# Patient Record
Sex: Male | Born: 1955
Health system: Southern US, Community
[De-identification: ages and names within clinical notes are randomized; demographics above are authoritative.]

## PROBLEM LIST (undated history)

## (undated) DIAGNOSIS — I10 Essential (primary) hypertension: Secondary | ICD-10-CM

## (undated) DIAGNOSIS — R0602 Shortness of breath: Secondary | ICD-10-CM

## (undated) DIAGNOSIS — K219 Gastro-esophageal reflux disease without esophagitis: Secondary | ICD-10-CM

## (undated) DIAGNOSIS — M1990 Unspecified osteoarthritis, unspecified site: Secondary | ICD-10-CM

## (undated) DIAGNOSIS — C801 Malignant (primary) neoplasm, unspecified: Secondary | ICD-10-CM

## (undated) HISTORY — PX: HERNIA REPAIR: SHX51

## (undated) HISTORY — PX: COLONOSCOPY: SHX174

## (undated) HISTORY — DX: Essential (primary) hypertension: I10

---

## 2008-04-13 ENCOUNTER — Encounter: Payer: Self-pay | Admitting: Gastroenterology

## 2008-05-03 ENCOUNTER — Ambulatory Visit (HOSPITAL_COMMUNITY): Admission: RE | Admit: 2008-05-03 | Discharge: 2008-05-03 | Payer: Self-pay | Admitting: Gastroenterology

## 2008-05-03 ENCOUNTER — Encounter: Payer: Self-pay | Admitting: Gastroenterology

## 2008-05-03 ENCOUNTER — Ambulatory Visit: Payer: Self-pay | Admitting: Gastroenterology

## 2010-06-03 NOTE — Op Note (Signed)
NAMEISSAK, GOLEY                ACCOUNT NO.:  [id number]   MEDICAL RECORD NO.:  [id number]          PATIENT TYPE:  AMB   LOCATION:  DAY                           FACILITY:  [facility]   PHYSICIAN:  Kassie Mends, M.D.      DATE OF BIRTH:  [birth year]   DATE OF PROCEDURE:  05/03/2008  DATE OF DISCHARGE:                               OPERATIVE REPORT   REFERRING PHYSICIAN:  Scott A. Gerda Diss, MD   PROCEDURE:  Colonoscopy with cold forceps polypectomy.   INDICATION FOR EXAM:  Mr. Popelka is a 55 year old male, who presents for  colon cancer screening. He has a family history of colon cancer.   FINDINGS:  1. A 3-4 mm polyps removed via cold forceps.  The polyps were in the      descending colon.  Two sigmoid colon polyps were removed via cold      forceps.  One rectal polyp removed via cold forceps.  2. Frequent diverticula seen extending from the sigmoid colon to the      hepatic flexure.  No masses, inflammatory changes, or AVMs seen.  3. Small internal hemorrhoids.  Anal canal lesion below the dentate      line.  Biopsy via cold forceps.  Otherwise, no polyps seen on      retroflexed view.   DIAGNOSES:  1. Colon polyps.  2. Moderate diverticulosis.  3. Small internal hemorrhoids.  4. Anal canal lesion likely an external hemorrhoid.   RECOMMENDATIONS:  1. Will call Mr. Chapdelaine with the results of his biopsies.  He may have      screening colonoscopy in 5 years.  2. He should follow a high-fiber diet.  He is given a handout on high-      fiber diet, polyps, and hemorrhoids.  3. No aspirin, NSAIDs, or anticoagulation for 7 days.   MEDICATIONS:  1. Demerol 100 mg IV.  2. Versed 6 mg IV.   PROCEDURE TECHNIQUE:  Physical exam was performed.  Informed consent was  obtained from the patient after explaining the benefits, risks, and  alternatives to the procedure.  The patient was connected to the monitor  and placed in left lateral position.  Continuous oxygen was provided by  nasal  cannula.  IV medicine administered through an indwelling cannula.  After administration of sedation and rectal exam, the patient's rectum  was intubated and the scope was advanced under direct visualization to  the cecum.  The scope was removed slowly by carefully examining the  color, texture, anatomy, and integrity of the mucosa on the way out.  The patient was recovered in endoscopy and discharged home in  satisfactory condition.   ADDENDUM 6110:  Simple adenoma and hyperplastic polyp. I belive he has a  family history of colon cancer. If so, TCS in 5 years. [phone] for  pt to call to discuss interval for TCS.      Kassie Mends, M.D.  Electronically Signed     SM/MEDQ  D:  05/03/2008  T:  05/04/2008  Job:  [id number]   cc:   Lorin Picket A. Gerda Diss, MD  Fax: [phone]

## 2012-07-13 ENCOUNTER — Other Ambulatory Visit: Payer: Self-pay | Admitting: *Deleted

## 2012-07-13 ENCOUNTER — Encounter: Payer: Self-pay | Admitting: *Deleted

## 2012-07-13 MED ORDER — LISINOPRIL 20 MG PO TABS: 20.0000 mg | ORAL_TABLET | Freq: Every day | ORAL | Status: DC

## 2012-08-26 ENCOUNTER — Other Ambulatory Visit: Payer: Self-pay | Admitting: Family Medicine

## 2012-08-26 LAB — BASIC METABOLIC PANEL
BUN: 17 mg/dL (ref 6–23)
Calcium: 8.8 mg/dL (ref 8.4–10.5)
Potassium: 4.4 mEq/L (ref 3.5–5.3)
Sodium: 139 mEq/L (ref 135–145)

## 2012-08-26 LAB — LIPID PANEL
Cholesterol: 197 mg/dL (ref 0–200)
Total CHOL/HDL Ratio: 6.8 Ratio
Triglycerides: 233 mg/dL — ABNORMAL HIGH (ref <150)
VLDL: 47 mg/dL — ABNORMAL HIGH (ref 0–40)

## 2012-08-30 ENCOUNTER — Ambulatory Visit (INDEPENDENT_AMBULATORY_CARE_PROVIDER_SITE_OTHER): Payer: Managed Care, Other (non HMO) | Admitting: Family Medicine

## 2012-08-30 ENCOUNTER — Encounter: Payer: Self-pay | Admitting: Family Medicine

## 2012-08-30 VITALS — BP 138/86 | Ht 66.0 in | Wt 250.6 lb

## 2012-08-30 DIAGNOSIS — I10 Essential (primary) hypertension: Secondary | ICD-10-CM

## 2012-08-30 DIAGNOSIS — M25551 Pain in right hip: Secondary | ICD-10-CM

## 2012-08-30 DIAGNOSIS — E785 Hyperlipidemia, unspecified: Secondary | ICD-10-CM | POA: Insufficient documentation

## 2012-08-30 DIAGNOSIS — M25559 Pain in unspecified hip: Secondary | ICD-10-CM

## 2012-08-30 DIAGNOSIS — E669 Obesity, unspecified: Secondary | ICD-10-CM

## 2012-08-30 MED ORDER — LISINOPRIL-HYDROCHLOROTHIAZIDE 20-12.5 MG PO TABS: 1.0000 | ORAL_TABLET | Freq: Every day | ORAL | Status: DC

## 2012-08-30 NOTE — Progress Notes (Signed)
  Subjective:    Patient ID: Brian Salazar, male    DOB: 1955-08-01, 57 y.o.   MRN: [id number]  HPI  Patient arrives for follow up on blood pressure and lab work. Patient does have high blood pressure he does try to take his medicine on regular basis he notices that it has been mildly elevated recently he denies any chest tightness pressure pain shortness breath or swelling in the legs. Denies any rectal bleeding. Recently had labs. These were went over with him in detail. His LDL slightly higher than what we would like to see HDL is lower. Patient has not tolerated statins in the past. Patient also relates he has had arthralgias going to have hip surgery in the near future he is hoping he'll be able to be more active once he gets his surgery completed. PMH HTN  Review of Systems See above    Objective:   Physical Exam Lungs are clear. Heart regular. Blood pressure mildly elevated abdomen soft obese extremities no edema       Assessment & Plan:  Essential hypertension, benign  Hyperlipidemia  Obesity, unspecified  Arthralgia of hip, right  Patient has hip replacement coming up soon he should do well with this. Increase blood pressure medication. Lisinopril/HCTZ. 20/12.5. Recheck blood pressure again he will check this at home he'll followup here within 5 months

## 2012-08-30 NOTE — Patient Instructions (Signed)
DASH Diet  The DASH diet stands for "Dietary Approaches to Stop Hypertension." It is a healthy eating plan that has been shown to reduce high blood pressure (hypertension) in as little as 14 days, while also possibly providing other significant health benefits. These other health benefits include reducing the risk of breast cancer after menopause and reducing the risk of type 2 diabetes, heart disease, colon cancer, and stroke. Health benefits also include weight loss and slowing kidney failure in patients with chronic kidney disease.   DIET GUIDELINES  · Limit salt (sodium). Your diet should contain less than 1500 mg of sodium daily.  · Limit refined or processed carbohydrates. Your diet should include mostly whole grains. Desserts and added sugars should be used sparingly.  · Include small amounts of heart-healthy fats. These types of fats include nuts, oils, and tub margarine. Limit saturated and trans fats. These fats have been shown to be harmful in the body.  CHOOSING FOODS   The following food groups are based on a 2000 calorie diet. See your Registered Dietitian for individual calorie needs.  Grains and Grain Products (6 to 8 servings daily)  · Eat More Often: Whole-wheat bread, brown rice, whole-grain or wheat pasta, quinoa, popcorn without added fat or salt (air popped).  · Eat Less Often: White bread, white pasta, white rice, cornbread.  Vegetables (4 to 5 servings daily)  · Eat More Often: Fresh, frozen, and canned vegetables. Vegetables may be raw, steamed, roasted, or grilled with a minimal amount of fat.  · Eat Less Often/Avoid: Creamed or fried vegetables. Vegetables in a cheese sauce.  Fruit (4 to 5 servings daily)  · Eat More Often: All fresh, canned (in natural juice), or frozen fruits. Dried fruits without added sugar. One hundred percent fruit juice (½ cup [237 mL] daily).  · Eat Less Often: Dried fruits with added sugar. Canned fruit in light or heavy syrup.  Lean Meats, Fish, and Poultry (2  servings or less daily. One serving is 3 to 4 oz [85-114 g]).  · Eat More Often: Ninety percent or leaner ground beef, tenderloin, sirloin. Round cuts of beef, chicken breast, turkey breast. All fish. Grill, bake, or broil your meat. Nothing should be fried.  · Eat Less Often/Avoid: Fatty cuts of meat, turkey, or chicken leg, thigh, or wing. Fried cuts of meat or fish.  Dairy (2 to 3 servings)  · Eat More Often: Low-fat or fat-free milk, low-fat plain or light yogurt, reduced-fat or part-skim cheese.  · Eat Less Often/Avoid: Milk (whole, 2%). Whole milk yogurt. Full-fat cheeses.  Nuts, Seeds, and Legumes (4 to 5 servings per week)  · Eat More Often: All without added salt.  · Eat Less Often/Avoid: Salted nuts and seeds, canned beans with added salt.  Fats and Sweets (limited)  · Eat More Often: Vegetable oils, tub margarines without trans fats, sugar-free gelatin. Mayonnaise and salad dressings.  · Eat Less Often/Avoid: Coconut oils, palm oils, butter, stick margarine, cream, half and half, cookies, candy, pie.  FOR MORE INFORMATION  The Dash Diet Eating Plan: www.dashdiet.org  Document Released: 12/25/2010 Document Revised: 03/30/2011 Document Reviewed: 12/25/2010  [employer]® Patient Information ©2014 [employer].

## 2012-08-31 NOTE — Progress Notes (Signed)
Need orders in EPIC.  Surgery scheduled for 09/16/12.  Thank You.  

## 2012-09-02 ENCOUNTER — Other Ambulatory Visit (HOSPITAL_COMMUNITY): Payer: Self-pay | Admitting: Orthopaedic Surgery

## 2012-09-05 ENCOUNTER — Encounter (HOSPITAL_COMMUNITY): Payer: Self-pay | Admitting: Pharmacy Technician

## 2012-09-08 ENCOUNTER — Other Ambulatory Visit (HOSPITAL_COMMUNITY): Payer: Self-pay | Admitting: Orthopaedic Surgery

## 2012-09-08 NOTE — Patient Instructions (Addendum)
22 Brian Salazar  09/08/2012 YOUR SURGERY IS SCHEDULED AT [facility]  ON:  Friday  8/29  REPORT TO [facility] STAY [facility] AT:  9:30 AM      PHONE # FOR SHORT STAY IS [phone]  DO NOT EAT OR DRINK ANYTHING AFTER MIDNIGHT THE NIGHT BEFORE YOUR SURGERY.  YOU MAY BRUSH YOUR TEETH, RINSE OUT YOUR MOUTH--BUT NO WATER, NO FOOD, NO CHEWING GUM, NO MINTS, NO CANDIES, NO CHEWING TOBACCO.  PLEASE TAKE THE FOLLOWING MEDICATIONS THE AM OF YOUR SURGERY WITH A FEW SIPS OF WATER:   PRILOSEC     DO NOT BRING VALUABLES, MONEY, CREDIT CARDS.  DO NOT WEAR JEWELRY, MAKE-UP, NAIL POLISH AND NO METAL PINS OR CLIPS IN YOUR HAIR. CONTACT LENS, DENTURES / PARTIALS, GLASSES SHOULD NOT BE WORN TO SURGERY AND IN MOST CASES-HEARING AIDS WILL NEED TO BE REMOVED.  BRING YOUR GLASSES CASE, ANY EQUIPMENT NEEDED FOR YOUR CONTACT LENS. FOR PATIENTS ADMITTED TO THE HOSPITAL--CHECK OUT TIME THE DAY OF DISCHARGE IS 11:00 AM.  ALL INPATIENT ROOMS ARE PRIVATE - WITH BATHROOM, TELEPHONE, TELEVISION AND WIFI INTERNET.                              PLEASE READ OVER ANY  FACT SHEETS THAT YOU WERE GIVEN: MRSA INFORMATION, BLOOD TRANSFUSION INFORMATION FAILURE TO FOLLOW THESE INSTRUCTIONS MAY RESULT IN THE CANCELLATION OF YOUR SURGERY.   PATIENT SIGNATURE_________________________________

## 2012-09-09 ENCOUNTER — Encounter (HOSPITAL_COMMUNITY): Payer: Self-pay

## 2012-09-09 ENCOUNTER — Ambulatory Visit (HOSPITAL_COMMUNITY)
Admission: RE | Admit: 2012-09-09 | Discharge: 2012-09-09 | Disposition: A | Discharge status: Home / Self Care | Payer: Managed Care, Other (non HMO) | Source: Ambulatory Visit | Attending: Orthopaedic Surgery | Admitting: Orthopaedic Surgery

## 2012-09-09 ENCOUNTER — Encounter (HOSPITAL_COMMUNITY)
Admission: RE | Admit: 2012-09-09 | Discharge: 2012-09-09 | Disposition: A | Discharge status: Home / Self Care | Payer: Managed Care, Other (non HMO) | Source: Ambulatory Visit | Attending: Orthopaedic Surgery | Admitting: Orthopaedic Surgery

## 2012-09-09 DIAGNOSIS — Z01818 Encounter for other preprocedural examination: Secondary | ICD-10-CM | POA: Insufficient documentation

## 2012-09-09 DIAGNOSIS — Z01812 Encounter for preprocedural laboratory examination: Secondary | ICD-10-CM | POA: Insufficient documentation

## 2012-09-09 DIAGNOSIS — M1610 Unilateral primary osteoarthritis, unspecified hip: Secondary | ICD-10-CM | POA: Insufficient documentation

## 2012-09-09 DIAGNOSIS — M169 Osteoarthritis of hip, unspecified: Secondary | ICD-10-CM | POA: Insufficient documentation

## 2012-09-09 DIAGNOSIS — Z01810 Encounter for preprocedural cardiovascular examination: Secondary | ICD-10-CM | POA: Insufficient documentation

## 2012-09-09 HISTORY — DX: Gastro-esophageal reflux disease without esophagitis: K21.9

## 2012-09-09 HISTORY — DX: Shortness of breath: R06.02

## 2012-09-09 HISTORY — DX: Unspecified osteoarthritis, unspecified site: M19.90

## 2012-09-09 LAB — CBC
HCT: 44.6 % (ref 39.0–52.0)
MCH: 30.4 pg (ref 26.0–34.0)
MCHC: 34.5 g/dL (ref 30.0–36.0)
MCV: 88 fL (ref 78.0–100.0)
Platelets: 197 10*3/uL (ref 150–400)
RDW: 13.2 % (ref 11.5–15.5)
WBC: 6.9 10*3/uL (ref 4.0–10.5)

## 2012-09-09 LAB — BASIC METABOLIC PANEL
BUN: 17 mg/dL (ref 6–23)
CO2: 26 mEq/L (ref 19–32)
Calcium: 9.2 mg/dL (ref 8.4–10.5)
Chloride: 101 mEq/L (ref 96–112)
Creatinine, Ser: 1.1 mg/dL (ref 0.50–1.35)
Glucose, Bld: 100 mg/dL — ABNORMAL HIGH (ref 70–99)

## 2012-09-09 LAB — URINALYSIS, ROUTINE W REFLEX MICROSCOPIC
Bilirubin Urine: NEGATIVE
Hgb urine dipstick: NEGATIVE
Ketones, ur: NEGATIVE mg/dL
Nitrite: NEGATIVE
Specific Gravity, Urine: 1.017 (ref 1.005–1.030)
Urobilinogen, UA: 0.2 mg/dL (ref 0.0–1.0)

## 2012-09-09 LAB — PROTIME-INR: INR: 0.98 (ref 0.00–1.49)

## 2012-09-09 LAB — SURGICAL PCR SCREEN
MRSA, PCR: NEGATIVE
Staphylococcus aureus: POSITIVE — AB

## 2012-09-09 NOTE — Progress Notes (Signed)
09/09/12 1145  OBSTRUCTIVE SLEEP APNEA  Have you ever been diagnosed with sleep apnea through a sleep study? No  Do you snore loudly (loud enough to be heard through closed doors)?  0  Do you often feel tired, fatigued, or sleepy during the daytime? 0  Has anyone observed you stop breathing during your sleep? 0  Do you have, or are you being treated for high blood pressure? 1  BMI more than 35 kg/m2? 1  Age over 56 years old? 1  Neck circumference greater than 40 cm/18 inches? 0  Gender: 1  Obstructive Sleep Apnea Score 4  Score 4 or greater  Results sent to PCP

## 2012-09-09 NOTE — Pre-Procedure Instructions (Signed)
EKG AND CXR WERE DONE TODAY - PREOP AT [facility]. 

## 2012-09-16 ENCOUNTER — Encounter (HOSPITAL_COMMUNITY)
Admission: RE | Disposition: A | Discharge status: Home Health | Payer: Self-pay | Source: Ambulatory Visit | Attending: Orthopaedic Surgery

## 2012-09-16 ENCOUNTER — Encounter (HOSPITAL_COMMUNITY): Payer: Self-pay | Admitting: *Deleted

## 2012-09-16 ENCOUNTER — Inpatient Hospital Stay (HOSPITAL_COMMUNITY): Payer: Managed Care, Other (non HMO)

## 2012-09-16 ENCOUNTER — Inpatient Hospital Stay (HOSPITAL_COMMUNITY): Payer: Managed Care, Other (non HMO) | Admitting: Anesthesiology

## 2012-09-16 ENCOUNTER — Encounter (HOSPITAL_COMMUNITY): Payer: Self-pay | Admitting: Anesthesiology

## 2012-09-16 ENCOUNTER — Inpatient Hospital Stay (HOSPITAL_COMMUNITY)
Admission: RE | Admit: 2012-09-16 | Discharge: 2012-09-18 | DRG: 470 | Disposition: A | Discharge status: Home Health | Payer: Managed Care, Other (non HMO) | Source: Ambulatory Visit | Attending: Orthopaedic Surgery | Admitting: Orthopaedic Surgery

## 2012-09-16 DIAGNOSIS — M169 Osteoarthritis of hip, unspecified: Secondary | ICD-10-CM

## 2012-09-16 DIAGNOSIS — E669 Obesity, unspecified: Secondary | ICD-10-CM

## 2012-09-16 DIAGNOSIS — I10 Essential (primary) hypertension: Secondary | ICD-10-CM

## 2012-09-16 DIAGNOSIS — M25551 Pain in right hip: Secondary | ICD-10-CM

## 2012-09-16 DIAGNOSIS — M1610 Unilateral primary osteoarthritis, unspecified hip: Principal | ICD-10-CM | POA: Diagnosis present

## 2012-09-16 DIAGNOSIS — K219 Gastro-esophageal reflux disease without esophagitis: Secondary | ICD-10-CM | POA: Diagnosis present

## 2012-09-16 DIAGNOSIS — E785 Hyperlipidemia, unspecified: Secondary | ICD-10-CM

## 2012-09-16 HISTORY — PX: TOTAL HIP ARTHROPLASTY: SHX124

## 2012-09-16 LAB — TYPE AND SCREEN

## 2012-09-16 SURGERY — ARTHROPLASTY, HIP, TOTAL, ANTERIOR APPROACH
Anesthesia: General | Site: Hip | Laterality: Right | Wound class: Clean

## 2012-09-16 MED ORDER — LISINOPRIL 20 MG PO TABS
20.0000 mg | ORAL_TABLET | Freq: Every day | ORAL | Status: DC
  Administered 2012-09-17 – 2012-09-18 (×2): 20 mg via ORAL
  Filled 2012-09-16 (×2): qty 1

## 2012-09-16 MED ORDER — CEFAZOLIN SODIUM-DEXTROSE 2-3 GM-% IV SOLR
2.0000 g | INTRAVENOUS | Status: AC
  Administered 2012-09-16: 2 g via INTRAVENOUS

## 2012-09-16 MED ORDER — ACETAMINOPHEN 500 MG PO TABS
1000.0000 mg | ORAL_TABLET | Freq: Once | ORAL | Status: AC
  Administered 2012-09-16: 1000 mg via ORAL
  Filled 2012-09-16: qty 1

## 2012-09-16 MED ORDER — METOCLOPRAMIDE HCL 10 MG PO TABS: 5.0000 mg | ORAL_TABLET | Freq: Three times a day (TID) | ORAL | Status: DC | PRN

## 2012-09-16 MED ORDER — ACETAMINOPHEN 650 MG RE SUPP: 650.0000 mg | Freq: Four times a day (QID) | RECTAL | Status: DC | PRN

## 2012-09-16 MED ORDER — 0.9 % SODIUM CHLORIDE (POUR BTL) OPTIME
TOPICAL | Status: DC | PRN
  Administered 2012-09-16: 1000 mL

## 2012-09-16 MED ORDER — SODIUM CHLORIDE 0.9 % IR SOLN
Status: DC | PRN
  Administered 2012-09-16: 1000 mL

## 2012-09-16 MED ORDER — STERILE WATER FOR IRRIGATION IR SOLN
Status: DC | PRN
  Administered 2012-09-16: 3000 mL

## 2012-09-16 MED ORDER — OXYCODONE HCL ER 10 MG PO T12A
10.0000 mg | EXTENDED_RELEASE_TABLET | Freq: Two times a day (BID) | ORAL | Status: DC
  Administered 2012-09-16 – 2012-09-18 (×4): 10 mg via ORAL
  Filled 2012-09-16 (×5): qty 1

## 2012-09-16 MED ORDER — MEPERIDINE HCL 50 MG/ML IJ SOLN: 6.2500 mg | INTRAMUSCULAR | Status: DC | PRN

## 2012-09-16 MED ORDER — OXYCODONE HCL 5 MG/5ML PO SOLN
5.0000 mg | Freq: Once | ORAL | Status: DC | PRN
  Filled 2012-09-16: qty 5

## 2012-09-16 MED ORDER — PHENYLEPHRINE HCL 10 MG/ML IJ SOLN
10.0000 mg | INTRAVENOUS | Status: DC | PRN
  Administered 2012-09-16: 15 ug/min via INTRAVENOUS

## 2012-09-16 MED ORDER — PHENOL 1.4 % MT LIQD: 1.0000 | OROMUCOSAL | Status: DC | PRN

## 2012-09-16 MED ORDER — LACTATED RINGERS IV SOLN
INTRAVENOUS | Status: DC | PRN
  Administered 2012-09-16 (×4): via INTRAVENOUS

## 2012-09-16 MED ORDER — METHOCARBAMOL 500 MG PO TABS
500.0000 mg | ORAL_TABLET | Freq: Four times a day (QID) | ORAL | Status: DC | PRN
  Filled 2012-09-16: qty 1

## 2012-09-16 MED ORDER — ACETAMINOPHEN 325 MG PO TABS: 650.0000 mg | ORAL_TABLET | Freq: Four times a day (QID) | ORAL | Status: DC | PRN

## 2012-09-16 MED ORDER — CELECOXIB 200 MG PO CAPS
200.0000 mg | ORAL_CAPSULE | Freq: Once | ORAL | Status: AC
  Administered 2012-09-16: 200 mg via ORAL
  Filled 2012-09-16 (×2): qty 1

## 2012-09-16 MED ORDER — ONDANSETRON HCL 4 MG/2ML IJ SOLN
4.0000 mg | Freq: Four times a day (QID) | INTRAMUSCULAR | Status: DC | PRN
  Administered 2012-09-16: 4 mg via INTRAVENOUS
  Filled 2012-09-16: qty 2

## 2012-09-16 MED ORDER — PROPOFOL 10 MG/ML IV BOLUS
INTRAVENOUS | Status: DC | PRN
  Administered 2012-09-16: 30 mg via INTRAVENOUS

## 2012-09-16 MED ORDER — HYDROMORPHONE HCL PF 1 MG/ML IJ SOLN
0.2500 mg | INTRAMUSCULAR | Status: DC | PRN
  Administered 2012-09-16 (×2): 0.5 mg via INTRAVENOUS

## 2012-09-16 MED ORDER — PROPOFOL INFUSION 10 MG/ML OPTIME
INTRAVENOUS | Status: DC | PRN
  Administered 2012-09-16: 160 ug/kg/min via INTRAVENOUS

## 2012-09-16 MED ORDER — ONDANSETRON HCL 4 MG PO TABS: 4.0000 mg | ORAL_TABLET | Freq: Four times a day (QID) | ORAL | Status: DC | PRN

## 2012-09-16 MED ORDER — MENTHOL 3 MG MT LOZG: 1.0000 | LOZENGE | OROMUCOSAL | Status: DC | PRN

## 2012-09-16 MED ORDER — OXYCODONE HCL 5 MG PO TABS
5.0000 mg | ORAL_TABLET | ORAL | Status: DC | PRN
  Administered 2012-09-16 – 2012-09-18 (×5): 10 mg via ORAL
  Filled 2012-09-16 (×6): qty 2

## 2012-09-16 MED ORDER — OXYCODONE HCL 5 MG PO TABS: 5.0000 mg | ORAL_TABLET | Freq: Once | ORAL | Status: DC | PRN

## 2012-09-16 MED ORDER — PROMETHAZINE HCL 25 MG/ML IJ SOLN: 6.2500 mg | INTRAMUSCULAR | Status: DC | PRN

## 2012-09-16 MED ORDER — ASPIRIN EC 325 MG PO TBEC
325.0000 mg | DELAYED_RELEASE_TABLET | Freq: Two times a day (BID) | ORAL | Status: DC
  Administered 2012-09-17 – 2012-09-18 (×3): 325 mg via ORAL
  Filled 2012-09-16 (×5): qty 1

## 2012-09-16 MED ORDER — CEFAZOLIN SODIUM 1-5 GM-% IV SOLN
1.0000 g | Freq: Four times a day (QID) | INTRAVENOUS | Status: AC
  Administered 2012-09-16 – 2012-09-17 (×2): 1 g via INTRAVENOUS
  Filled 2012-09-16 (×2): qty 50

## 2012-09-16 MED ORDER — DIPHENHYDRAMINE HCL 12.5 MG/5ML PO ELIX: 12.5000 mg | ORAL_SOLUTION | ORAL | Status: DC | PRN

## 2012-09-16 MED ORDER — PANTOPRAZOLE SODIUM 40 MG PO TBEC
40.0000 mg | DELAYED_RELEASE_TABLET | Freq: Every day | ORAL | Status: DC
  Administered 2012-09-17 – 2012-09-18 (×2): 40 mg via ORAL
  Filled 2012-09-16 (×2): qty 1

## 2012-09-16 MED ORDER — ALUM & MAG HYDROXIDE-SIMETH 200-200-20 MG/5ML PO SUSP: 30.0000 mL | ORAL | Status: DC | PRN

## 2012-09-16 MED ORDER — METHOCARBAMOL 100 MG/ML IJ SOLN
500.0000 mg | Freq: Four times a day (QID) | INTRAVENOUS | Status: DC | PRN
  Administered 2012-09-16: 500 mg via INTRAVENOUS
  Filled 2012-09-16: qty 5

## 2012-09-16 MED ORDER — HYDROMORPHONE HCL PF 1 MG/ML IJ SOLN
INTRAMUSCULAR | Status: AC
  Administered 2012-09-16: 1 mg via INTRAVENOUS
  Filled 2012-09-16: qty 1

## 2012-09-16 MED ORDER — SODIUM CHLORIDE 0.9 % IV SOLN
INTRAVENOUS | Status: DC
  Administered 2012-09-16 – 2012-09-17 (×2): via INTRAVENOUS

## 2012-09-16 MED ORDER — POLYETHYLENE GLYCOL 3350 17 G PO PACK
17.0000 g | PACK | Freq: Every day | ORAL | Status: DC | PRN
  Administered 2012-09-17: 22:00:00 17 g via ORAL

## 2012-09-16 MED ORDER — DOCUSATE SODIUM 100 MG PO CAPS
100.0000 mg | ORAL_CAPSULE | Freq: Two times a day (BID) | ORAL | Status: DC
  Administered 2012-09-16 – 2012-09-18 (×4): 100 mg via ORAL

## 2012-09-16 MED ORDER — LISINOPRIL-HYDROCHLOROTHIAZIDE 20-12.5 MG PO TABS: 1.0000 | ORAL_TABLET | Freq: Every day | ORAL | Status: DC

## 2012-09-16 MED ORDER — LACTATED RINGERS IV SOLN
INTRAVENOUS | Status: DC
  Administered 2012-09-16: 1000 mL via INTRAVENOUS

## 2012-09-16 MED ORDER — HYDROMORPHONE HCL PF 1 MG/ML IJ SOLN
1.0000 mg | INTRAMUSCULAR | Status: DC | PRN
  Administered 2012-09-16 – 2012-09-17 (×4): 1 mg via INTRAVENOUS
  Filled 2012-09-16 (×4): qty 1

## 2012-09-16 MED ORDER — HYDROCHLOROTHIAZIDE 12.5 MG PO CAPS
12.5000 mg | ORAL_CAPSULE | Freq: Every day | ORAL | Status: DC
  Administered 2012-09-17 – 2012-09-18 (×2): 12.5 mg via ORAL
  Filled 2012-09-16 (×2): qty 1

## 2012-09-16 MED ORDER — ZOLPIDEM TARTRATE 5 MG PO TABS: 5.0000 mg | ORAL_TABLET | Freq: Every evening | ORAL | Status: DC | PRN

## 2012-09-16 MED ORDER — FENTANYL CITRATE 0.05 MG/ML IJ SOLN
INTRAMUSCULAR | Status: DC | PRN
  Administered 2012-09-16 (×2): 50 ug via INTRAVENOUS

## 2012-09-16 MED ORDER — PHENYLEPHRINE HCL 10 MG/ML IJ SOLN
INTRAMUSCULAR | Status: DC | PRN
  Administered 2012-09-16: 80 ug via INTRAVENOUS
  Administered 2012-09-16: 120 ug via INTRAVENOUS
  Administered 2012-09-16: 80 ug via INTRAVENOUS

## 2012-09-16 MED ORDER — METOCLOPRAMIDE HCL 5 MG/ML IJ SOLN: 5.0000 mg | Freq: Three times a day (TID) | INTRAMUSCULAR | Status: DC | PRN

## 2012-09-16 MED ORDER — MIDAZOLAM HCL 5 MG/5ML IJ SOLN
INTRAMUSCULAR | Status: DC | PRN
  Administered 2012-09-16: 2 mg via INTRAVENOUS

## 2012-09-16 SURGICAL SUPPLY — 40 items
ADH SKN CLS APL DERMABOND .7 (GAUZE/BANDAGES/DRESSINGS) ×1
BAG SPEC THK2 15X12 ZIP CLS (MISCELLANEOUS) ×2
BAG ZIPLOCK 12X15 (MISCELLANEOUS) ×4 IMPLANT
BLADE SAW SGTL 18X1.27X75 (BLADE) ×2 IMPLANT
CAPT HIP PF MOP ×1 IMPLANT
CELLS DAT CNTRL 66122 CELL SVR (MISCELLANEOUS) ×1 IMPLANT
CLOTH BEACON ORANGE TIMEOUT ST (SAFETY) ×2 IMPLANT
DERMABOND ADVANCED (GAUZE/BANDAGES/DRESSINGS) ×1
DERMABOND ADVANCED .7 DNX12 (GAUZE/BANDAGES/DRESSINGS) ×1 IMPLANT
DRAPE C-ARM 42X120 X-RAY (DRAPES) ×2 IMPLANT
DRAPE STERI IOBAN 125X83 (DRAPES) ×2 IMPLANT
DRAPE U-SHAPE 47X51 STRL (DRAPES) ×6 IMPLANT
DRSG AQUACEL AG ADV 3.5X10 (GAUZE/BANDAGES/DRESSINGS) ×2 IMPLANT
DURAPREP 26ML APPLICATOR (WOUND CARE) ×2 IMPLANT
ELECT BLADE TIP CTD 4 INCH (ELECTRODE) ×2 IMPLANT
ELECT REM PT RETURN 9FT ADLT (ELECTROSURGICAL) ×2
ELECTRODE REM PT RTRN 9FT ADLT (ELECTROSURGICAL) ×1 IMPLANT
FACESHIELD LNG OPTICON STERILE (SAFETY) ×8 IMPLANT
GLOVE BIO SURGEON STRL SZ7.5 (GLOVE) ×2 IMPLANT
GLOVE BIOGEL PI IND STRL 8 (GLOVE) ×2 IMPLANT
GLOVE BIOGEL PI INDICATOR 8 (GLOVE) ×2
GLOVE ECLIPSE 8.0 STRL XLNG CF (GLOVE) ×2 IMPLANT
GOWN STRL REIN XL XLG (GOWN DISPOSABLE) ×4 IMPLANT
HANDPIECE INTERPULSE COAX TIP (DISPOSABLE) ×2
KIT BASIN OR (CUSTOM PROCEDURE TRAY) ×2 IMPLANT
PACK TOTAL JOINT (CUSTOM PROCEDURE TRAY) ×2 IMPLANT
PADDING CAST COTTON 6X4 STRL (CAST SUPPLIES) ×2 IMPLANT
RETRACTOR WND ALEXIS 18 MED (MISCELLANEOUS) ×1 IMPLANT
RTRCTR WOUND ALEXIS 18CM MED (MISCELLANEOUS) ×2
SET HNDPC FAN SPRY TIP SCT (DISPOSABLE) ×1 IMPLANT
SUT ETHIBOND NAB CT1 #1 30IN (SUTURE) ×4 IMPLANT
SUT ETHILON 3 0 PS 1 (SUTURE) ×2 IMPLANT
SUT MNCRL AB 4-0 PS2 18 (SUTURE) ×2 IMPLANT
SUT VIC AB 0 CT1 36 (SUTURE) ×2 IMPLANT
SUT VIC AB 1 CT1 36 (SUTURE) ×4 IMPLANT
SUT VIC AB 2-0 CT1 27 (SUTURE) ×4
SUT VIC AB 2-0 CT1 TAPERPNT 27 (SUTURE) ×2 IMPLANT
TOWEL OR 17X26 10 PK STRL BLUE (TOWEL DISPOSABLE) ×2 IMPLANT
TOWEL OR NON WOVEN STRL DISP B (DISPOSABLE) ×2 IMPLANT
TRAY FOLEY CATH 14FRSI W/METER (CATHETERS) ×2 IMPLANT

## 2012-09-16 NOTE — Transfer of Care (Signed)
Immediate Anesthesia Transfer of Care Note  Patient: Brian Salazar  Procedure(s) Performed: Procedure(s) with comments: RIGHT TOTAL HIP ARTHROPLASTY ANTERIOR APPROACH (Right) - Right Total Hip Arthroplasty, Anterior Approach  Patient Location: PACU  Anesthesia Type:Regional and Spinal  Level of Consciousness: awake, alert , oriented and patient cooperative  Airway & Oxygen Therapy: Patient Spontanous Breathing and Patient connected to face mask oxygen  Post-op Assessment: Report given to PACU RN and Post -op Vital signs reviewed and stable  Post vital signs: Reviewed and stable  Complications: No apparent anesthesia complications

## 2012-09-16 NOTE — Anesthesia Preprocedure Evaluation (Addendum)
Anesthesia Evaluation  Patient identified by MRN, date of birth, ID band Patient awake    Reviewed: Allergy & Precautions, H&P , NPO status , Patient's Chart, lab work & pertinent test results  Airway Mallampati: II TM Distance: >3 FB Neck ROM: Full    Dental  (+) Dental Advisory Given   Pulmonary shortness of breath and with exertion,  breath sounds clear to auscultation        Cardiovascular hypertension, Pt. on medications Rhythm:Regular Rate:Normal     Neuro/Psych negative neurological ROS  negative psych ROS   GI/Hepatic Neg liver ROS, GERD-  Medicated,  Endo/Other  negative endocrine ROS  Renal/GU negative Renal ROS     Musculoskeletal negative musculoskeletal ROS (+)   Abdominal (+) + obese,   Peds  Hematology negative hematology ROS (+)   Anesthesia Other Findings   Reproductive/Obstetrics negative OB ROS                          Anesthesia Physical Anesthesia Plan  ASA: III  Anesthesia Plan: Spinal   Post-op Pain Management:    Induction:   Airway Management Planned:   Additional Equipment:   Intra-op Plan:   Post-operative Plan:   Informed Consent: I have reviewed the patients History and Physical, chart, labs and discussed the procedure including the risks, benefits and alternatives for the proposed anesthesia with the patient or authorized representative who has indicated his/her understanding and acceptance.   Dental advisory given  Plan Discussed with: CRNA  Anesthesia Plan Comments:        Anesthesia Quick Evaluation

## 2012-09-16 NOTE — Brief Op Note (Signed)
09/16/2012  2:27 PM  PATIENT:  Brian Salazar  57 y.o. male  PRE-OPERATIVE DIAGNOSIS:  Severe OA/DJD right Hip  POST-OPERATIVE DIAGNOSIS:  Severe OA/DJD right Hip  PROCEDURE:  Procedure(s) with comments: RIGHT TOTAL HIP ARTHROPLASTY ANTERIOR APPROACH (Right) - Right Total Hip Arthroplasty, Anterior Approach  SURGEON:  Surgeon(s) and Role:    * Kathryne Hitch, MD - Primary  PHYSICIAN ASSISTANT: Rexene Edison, PA-C  ANESTHESIA:   spinal  EBL:  Total I/O In: 3000 [I.V.:3000] Out: 550 [Urine:100; Blood:450]  BLOOD ADMINISTERED:none  DRAINS: none   LOCAL MEDICATIONS USED:  NONE  SPECIMEN:  No Specimen  DISPOSITION OF SPECIMEN:  N/A  COUNTS:  YES  TOURNIQUET:  * No tourniquets in log *  DICTATION: .Other Dictation: Dictation Number [id number]  PLAN OF CARE: Admit to inpatient   PATIENT DISPOSITION:  PACU - hemodynamically stable.   Delay start of Pharmacological VTE agent (>24hrs) due to surgical blood loss or risk of bleeding: no

## 2012-09-16 NOTE — Anesthesia Procedure Notes (Signed)
Spinal  Patient location during procedure: OR Start time: 09/16/2012 12:42 PM End time: 09/16/2012 12:46 PM Staffing Anesthesiologist: Lewie Loron R Performed by: anesthesiologist  Preanesthetic Checklist Completed: patient identified, site marked, surgical consent, pre-op evaluation, timeout performed, IV checked, risks and benefits discussed and monitors and equipment checked Spinal Block Patient position: sitting Prep: ChloraPrep Patient monitoring: heart rate, continuous pulse ox and blood pressure Approach: midline Location: L2-3 Injection technique: single-shot Needle Needle type: Sprotte  Needle gauge: 24 G Needle length: 9 cm Assessment Sensory level: T8 Additional Notes Expiration date of kit checked and confirmed. Patient tolerated procedure well, without complications.

## 2012-09-16 NOTE — H&P (Signed)
TOTAL HIP ADMISSION H&P  Patient is admitted for right total hip arthroplasty.  Subjective:  Chief Complaint: right hip pain  HPI: Brian Salazar, 57 y.o. male, has a history of pain and functional disability in the right hip(s) due to arthritis and patient has failed non-surgical conservative treatments for greater than 12 weeks to include NSAID's and/or analgesics, corticosteriod injections, use of assistive devices, weight reduction as appropriate and activity modification.  Onset of symptoms was gradual starting 4 years ago with gradually worsening course since that time.The patient noted no past surgery on the right hip(s).  Patient currently rates pain in the right hip at 9 out of 10 with activity. Patient has night pain, worsening of pain with activity and weight bearing, trendelenberg gait, pain that interfers with activities of daily living and pain with passive range of motion. Patient has evidence of subchondral cysts, subchondral sclerosis, periarticular osteophytes and joint space narrowing by imaging studies. This condition presents safety issues increasing the risk of falls.  There is no current active infection.  Patient Active Problem List   Diagnosis Date Noted  . Degenerative arthritis of hip, right 09/16/2012  . Essential hypertension, benign 08/30/2012  . Hyperlipidemia 08/30/2012  . Obesity, unspecified 08/30/2012  . Arthralgia of hip 08/30/2012   Past Medical History  Diagnosis Date  . Hypertension   . Shortness of breath     with exertion  . GERD (gastroesophageal reflux disease)     occas  . Arthritis     OA RIGHT HIP    Past Surgical History  Procedure Laterality Date  . Colonoscopy      No prescriptions prior to admission   Allergies  Allergen Reactions  . Simvastatin     Made me feel funny    History  Substance Use Topics  . Smoking status: Never Smoker   . Smokeless tobacco: Never Used  . Alcohol Use: Yes     Comment: RARE ALCOHOL    No  family history on file.   Review of Systems  Musculoskeletal: Positive for joint pain.  All other systems reviewed and are negative.    Objective:  Physical Exam  Constitutional: He is oriented to person, place, and time. He appears well-developed and well-nourished.  HENT:  Head: Normocephalic and atraumatic.  Eyes: EOM are normal. Pupils are equal, round, and reactive to light.  Neck: Neck supple.  Cardiovascular: Normal rate and regular rhythm.   Respiratory: Effort normal and breath sounds normal.  GI: Soft. Bowel sounds are normal.  Musculoskeletal:       Right hip: He exhibits decreased range of motion, decreased strength, bony tenderness and crepitus.  Neurological: He is alert and oriented to person, place, and time.  Skin: Skin is warm and dry.  Psychiatric: He has a normal mood and affect.    Vital signs in last 24 hours:    Labs:   There is no height or weight on file to calculate BMI.   Imaging Review Plain radiographs demonstrate severe degenerative joint disease of the right hip(s). The bone quality appears to be excellent for age and reported activity level.  Assessment/Plan:  End stage arthritis, right hip(s)  The patient history, physical examination, clinical judgement of the provider and imaging studies are consistent with end stage degenerative joint disease of the right hip(s) and total hip arthroplasty is deemed medically necessary. The treatment options including medical management, injection therapy, arthroscopy and arthroplasty were discussed at length. The risks and benefits of total hip  arthroplasty were presented and reviewed. The risks due to aseptic loosening, infection, stiffness, dislocation/subluxation,  thromboembolic complications and other imponderables were discussed.  The patient acknowledged the explanation, agreed to proceed with the plan and consent was signed. Patient is being admitted for inpatient treatment for surgery, pain  control, PT, OT, prophylactic antibiotics, VTE prophylaxis, progressive ambulation and ADL's and discharge planning.The patient is planning to be discharged home with home health services

## 2012-09-17 LAB — BASIC METABOLIC PANEL
Calcium: 8.2 mg/dL — ABNORMAL LOW (ref 8.4–10.5)
Creatinine, Ser: 0.97 mg/dL (ref 0.50–1.35)
GFR calc Af Amer: >90 mL/min (ref >90)

## 2012-09-17 LAB — CBC
MCH: 29.8 pg (ref 26.0–34.0)
MCV: 88.5 fL (ref 78.0–100.0)
Platelets: 174 10*3/uL (ref 150–400)
RDW: 13.1 % (ref 11.5–15.5)

## 2012-09-17 MED ORDER — METHOCARBAMOL 500 MG PO TABS: 500.0000 mg | ORAL_TABLET | Freq: Four times a day (QID) | ORAL | Status: DC | PRN

## 2012-09-17 MED ORDER — OXYCODONE-ACETAMINOPHEN 5-325 MG PO TABS
1.0000 | ORAL_TABLET | ORAL | Status: DC | PRN
Start: 2012-09-17 — End: 2013-07-07

## 2012-09-17 MED ORDER — ASPIRIN 325 MG PO TBEC: 325.0000 mg | DELAYED_RELEASE_TABLET | Freq: Two times a day (BID) | ORAL | Status: DC

## 2012-09-17 NOTE — Care Management Note (Addendum)
    Page 1 of 1   09/18/2012     12:45:04 PM   CARE MANAGEMENT NOTE 09/18/2012  Patient:  Brian Salazar, Brian Salazar   Account Number:  [id number]  Date Initiated:  09/17/2012  Documentation initiated by:  [facility]  Subjective/Objective Assessment:   57 Y/O M ADMITTED W/R HIP DEGENERATIVE ARTHRITIS.     Action/Plan:   FROM HOMEW/SPOUSE.HAS PCP,PHARMACY.ALREADY HAS 3N1,RW.   Anticipated DC Date:  09/18/2012   Anticipated DC Plan:  HOME W HOME HEALTH SERVICES      DC Planning Services  CM consult      Choice offered to / List presented to:  C-1 Patient        HH arranged  HH-2 PT      [facility] agency  [facility]   Status of service:  Completed, signed off Medicare Important Message given?   (If response is "NO", the following Medicare IM given date fields will be blank) Date Medicare IM given:   Date Additional Medicare IM given:    Discharge Disposition:  HOME W HOME HEALTH SERVICES  Per UR Regulation:  Reviewed for med. necessity/level of care/duration of stay  If discussed at Long Length of Stay Meetings, dates discussed:    Comments:  09/18/12 Tyrelle Raczka RN,BSN NCM WEEKEND [phone] D/C HOME W/HHPT.[facility] REP JAMIE AWARE OF D/C,& HHPT ORDER.NO FURTHER D/C NEEDS OR ORDERS.  09/17/12 Rane Blitch RN,BSN NCM WEEKEND [phone] POD#1 R THA.PT-HH.[facility] CHOSEN FOR HH.TC [facility] REP JAMIE [phone],AWARE OF REFERRAL.AWAIT FINAL HH ORDERS.

## 2012-09-17 NOTE — Progress Notes (Signed)
Subjective:  Patient reports pain as moderate.  No events  Objective:   VITALS:   Filed Vitals:   09/17/12 0150 09/17/12 0620 09/17/12 0800 09/17/12 1012  BP: 143/77 135/87  159/88  Pulse: 75 75  89  Temp: 98.2 F (36.8 C) 98.5 F (36.9 C)  98.9 F (37.2 C)  TempSrc: Oral Oral  Oral  Resp: 16 16 18 16   Height:      Weight:      SpO2: 100% 100% 99% 98%    Neurologically intact Neurovascular intact Sensation intact distally Intact pulses distally Dorsiflexion/Plantar flexion intact Incision: dressing C/D/I No cellulitis present Compartment soft   Lab Results  Component Value Date   WBC 7.7 09/17/2012   HGB 13.0 09/17/2012   HCT 38.6* 09/17/2012   MCV 88.5 09/17/2012   PLT 174 09/17/2012     Assessment/Plan: 1 Day Post-Op   Principal Problem:   Degenerative arthritis of hip, right   Advance diet Up with therapy   Cheral Almas 09/17/2012, 10:22 AM

## 2012-09-17 NOTE — Progress Notes (Signed)
Physical Therapy Treatment Patient Details Name: Brian Salazar MRN: [id number] DOB: Jan 03, 1956 Today's Date: 09/17/2012 Time: 0865-7846 PT Time Calculation (min): 37 min  PT Assessment / Plan / Recommendation  History of Present Illness     PT Comments     Follow Up Recommendations  Home health PT     Does the patient have the potential to tolerate intense rehabilitation     Barriers to Discharge        Equipment Recommendations  None recommended by PT    Recommendations for Other Services OT consult  Frequency 7X/week   Progress towards PT Goals Progress towards PT goals: Progressing toward goals  Plan Current plan remains appropriate    Precautions / Restrictions Precautions Precautions: Fall Restrictions Weight Bearing Restrictions: No Other Position/Activity Restrictions: WBAT   Pertinent Vitals/Pain 4/10; premed    Mobility  Bed Mobility Bed Mobility: Sit to Supine Sit to Supine: 3: Mod assist Details for Bed Mobility Assistance: cues for sequence and use of UEs and L LE to self assist.  Physical assist for R LE management and to bring trunk to upright 2 * abdominal prominence Transfers Transfers: Sit to Stand;Stand to Sit Sit to Stand: 4: Min assist;From chair/3-in-1;With armrests;With upper extremity assist Stand to Sit: 4: Min assist;With upper extremity assist;To bed Details for Transfer Assistance: cues for LE management and use of UEs to self assist Ambulation/Gait Ambulation/Gait Assistance: 4: Min assist Ambulation Distance (Feet): 111 Feet Assistive device: Rolling walker Ambulation/Gait Assistance Details: cues for posture, sequence, stride length and position from RW Gait Pattern: Step-to pattern;Decreased step length - right;Decreased step length - left;Antalgic;Trunk flexed Gait velocity: slow General Gait Details: multiple standing rest breaks required for task completion Stairs: No    Exercises     PT Diagnosis:    PT Problem List:   PT  Treatment Interventions:     PT Goals (current goals can now be found in the care plan section) Acute Rehab PT Goals Patient Stated Goal: Resume previous lifestyle with decreased pain PT Goal Formulation: With patient Time For Goal Achievement: 09/24/12 Potential to Achieve Goals: Good  Visit Information  Last PT Received On: 09/17/12 Assistance Needed: +1    Subjective Data  Subjective: Wait just let me stop and stretch Patient Stated Goal: Resume previous lifestyle with decreased pain   Cognition  Cognition Arousal/Alertness: Awake/alert Behavior During Therapy: WFL for tasks assessed/performed Overall Cognitive Status: Within Functional Limits for tasks assessed    Balance     End of Session PT - End of Session Equipment Utilized During Treatment: Gait belt Activity Tolerance: Patient tolerated treatment well Patient left: in bed;with call bell/phone within reach;with family/visitor present Nurse Communication: Mobility status   GP     Brian Salazar 09/17/2012, 5:15 PM

## 2012-09-17 NOTE — Op Note (Signed)
NAMEJANI, PLOEGER NO.:  [id number]  MEDICAL RECORD NO.:  [id number]  LOCATION:  1605                         FACILITY:  [facility]  PHYSICIAN:  Vanita Panda. Magnus Ivan, M.D.DATE OF BIRTH:  02/21/1955  DATE OF PROCEDURE:  09/16/2012 DATE OF DISCHARGE:                              OPERATIVE REPORT   PREOPERATIVE DIAGNOSIS:  Severe end-stage arthritis and degenerative joint disease, right hip.  POSTOPERATIVE DIAGNOSIS:  Severe end-stage arthritis and degenerative joint disease, right hip.  PROCEDURE:  Right total hip arthroplasty through direct anterior approach.  IMPLANTS:  DePuy Sector Gription acetabular component size 54, size 36+ 4 neutral polyethylene liner, size 10 Corail femoral component with varus offset (KLA), size 36- 2 metal hip ball.  SURGEON:  Vanita Panda. Magnus Ivan, M.D.  ASSISTANT:  Richardean Canal, PA-C.  ANESTHESIA:  Spinal.  ANTIBIOTICS:  2 g IV Ancef.  BLOOD LOSS:  450 mL.  COMPLICATIONS:  None.  INDICATIONS:  Brian Salazar is a 57 year old gentleman well known to me. He has severe debilitating end-stage arthritis of his right hip.  He has failed conservative treatment.  He has had injections in his hip.  His pain is daily.  His mobility is limited.  His quality of life was diminished.  At this point, he wishes to proceed with a total hip arthroplasty through a direct anterior approach.  He understands the risks of acute blood loss anemia, nerve and vessel injury, fracture, infection, DVT.  The goals are to improve mobility, decrease pain, and overall improve quality of life.  PROCEDURE DESCRIPTION:  After informed consent was obtained, appropriate right hip was marked.  He was brought to the operating room and spinal anesthesia was obtained while he was on a stretcher.  He was then laid in supine position, a Foley catheter was placed and in both feet, traction boots applied to them.  He was next placed supine on the Hana fracture  table with the perineal post in place and both legs in inline skeletal traction devices, but no traction applied.  His hip and pelvis were assessed fluoroscopically.  We are able to the tape his abdomen all the way to get a good view.  We then had the right operative hip prepped and draped with DuraPrep and sterile drapes.  A time-out was called to identify the correct patient, correct right hip.  We then made an incision inferior and posterior to the anterior-superior iliac spine and carried this obliquely down the leg.  I dissected down to the tensor fascia lata muscle and the tensor fascia was divided longitudinally.  I then proceeded with a direct anterior approach of the hip.  I cauterized the lateral femoral circumflex vessels.  I then placed Cobra retractors around the lateral neck and the medial femoral neck.  I opened the hip capsule in a L-type format and placed Cobra retractors within the capsule and found significant changes of the cartilage.  I then made my femoral neck cut with an oscillating saw just proximal to the lesser trochanter and completed this on osteotome.  A corkscrew guide was placed in the femoral head, and the femoral head was removed in its entirety and found to be  devoid of cartilage.  I then cleaned the acetabular debris including remnants of acetabular labrum.  A bent Hohmann was placed medially and a Cobra retractor laterally.  I then began reaming and 2 mm increments from size 42 reamer all the way up to a size 54 reamer.  All reamers placed under direct visualization.  The last reamer also under direct fluoroscopy, so we could obtain our final depth of reaming and inclination and anteversion.  Once this was completed, I placed a real DePuy Sector Gription acetabular component size 54, the apex hole eliminator guide and a real 36+ 4 neutral polyethylene liner for a size 54 acetabular component.  Attention was then turned to the femur.  With the femur  externally rotated 100 degrees, extended and adducted, I placed a Mueller retractor medially and Hohmann retractor behind the greater trochanter.  I released the lateral joint capsule and then used a box cutting osteotome at the femoral canal and a rongeur to lateralize, I then began broaching from a size 8 broach using the Corail broaching system from DePuy, up to a size 10.  The 10 was nice stable fit.  I used a calcar planer off this and then trialed a varus offset neck given what his anatomy looked like.  We trialed a 36+ 1.5 femoral head, brought the leg back over and up with traction and internal rotation, reduced, and felt that it was stable throughout its arc of rotation with minimal shuck.  It was quite tight even with the measures of leg lengths seemed to be long, so we then dislocated the hip and trialed a 36- 2 hip ball and he was much better on leg lengths and offset.  We then dislocated the hip and removed all trial components.  We placed a real Corail femoral component from DePuy size 10 with varus offset and a real 36- 2 metal hip ball.  We reduced this back in the acetabulum and was again stable.  We then copiously irrigated the soft tissues with normal saline solution using pulsatile lavage.  We closed the joint capsule with interrupted #1 Ethibond suture followed by a running 0 V-Loc in the tensor fascia lata.  The 0 Vicryl in the deep tissue, 2-0 Vicryl in the subcutaneous tissue, 4-0 Monocryl subcuticular stitch and Dermabond on the skin, and Aquacel dressing was applied.  He was taken off the Hana table into the recovery room in stable condition.  All final counts were correct.  There were no complications noted.     Vanita Panda. Magnus Ivan, M.D.     CYB/MEDQ  D:  09/16/2012  T:  09/17/2012  Job:  [id number]

## 2012-09-17 NOTE — Evaluation (Signed)
Physical Therapy Evaluation Patient Details Name: Brian Salazar MRN: [id number] DOB: 1955-11-13 Today's Date: 09/17/2012 Time: 4098-1191 PT Time Calculation (min): 38 min  PT Assessment / Plan / Recommendation History of Present Illness     Clinical Impression  Pt s/p R THR presents with functional mobility limitations 2* decreased R LE strength/ROM and post op pain.    PT Assessment  Patient needs continued PT services    Follow Up Recommendations  Home health PT    Does the patient have the potential to tolerate intense rehabilitation      Barriers to Discharge        Equipment Recommendations  None recommended by PT    Recommendations for Other Services OT consult   Frequency 7X/week    Precautions / Restrictions Precautions Precautions: Fall Restrictions Weight Bearing Restrictions: No Other Position/Activity Restrictions: WBAT   Pertinent Vitals/Pain 4/10 at rest - increased with activity; premed, RN aware, ice packs provided      Mobility  Bed Mobility Bed Mobility: Supine to Sit Supine to Sit: 1: +2 Total assist Supine to Sit: Patient Percentage: 60% Details for Bed Mobility Assistance: cues for sequence and use of UEs and L LE to self assist.  Physical assist for R LE management and to bring trunk to upright 2 * abdominal prominence Transfers Transfers: Sit to Stand;Stand to Sit Sit to Stand: 4: Min assist;From bed;From elevated surface Stand to Sit: 4: Min assist;To chair/3-in-1;With upper extremity assist Details for Transfer Assistance: cues for LE management and use of UEs to self assist Ambulation/Gait Ambulation/Gait Assistance: 4: Min assist Ambulation Distance (Feet): 14 Feet Assistive device: Rolling walker Ambulation/Gait Assistance Details: cues for posture, sequence, position from RW Gait Pattern: Step-to pattern;Decreased step length - right;Decreased step length - left;Antalgic;Trunk flexed Gait velocity: slow General Gait Details: ltd  by increased pain and c/o getting too hot Stairs: No    Exercises Total Joint Exercises Ankle Circles/Pumps: AROM;Both;15 reps;Supine Quad Sets: AROM;Both;10 reps;Supine Heel Slides: AAROM;15 reps;Right;Supine Hip ABduction/ADduction: AAROM;Right;15 reps;Supine   PT Diagnosis: Difficulty walking  PT Problem List: Decreased strength;Decreased activity tolerance;Decreased range of motion;Decreased mobility;Decreased knowledge of use of DME;Pain;Obesity PT Treatment Interventions: DME instruction;Gait training;Stair training;Functional mobility training;Therapeutic activities;Therapeutic exercise;Patient/family education     PT Goals(Current goals can be found in the care plan section) Acute Rehab PT Goals Patient Stated Goal: Resume previous lifestyle with decreased pain PT Goal Formulation: With patient Time For Goal Achievement: 09/24/12 Potential to Achieve Goals: Good  Visit Information  Last PT Received On: 09/17/12 Assistance Needed: +2 (bed mobility 2* obesity)       Prior Functioning  Home Living Family/patient expects to be discharged to:: Private residence Living Arrangements: Spouse/significant other Available Help at Discharge: Family Type of Home: House Home Access: Stairs to enter Secretary/administrator of Steps: 5 Entrance Stairs-Rails: Right Home Layout: One level Home Equipment: Environmental consultant - 2 wheels;Crutches Prior Function Level of Independence: Independent Communication Communication: No difficulties    Cognition  Cognition Arousal/Alertness: Awake/alert Behavior During Therapy: WFL for tasks assessed/performed Overall Cognitive Status: Within Functional Limits for tasks assessed    Extremity/Trunk Assessment Upper Extremity Assessment Upper Extremity Assessment: Overall WFL for tasks assessed Lower Extremity Assessment Lower Extremity Assessment: RLE deficits/detail RLE Deficits / Details: Hip strength 2/5 with AAROM at hip to 70 flex and 20 abd    Balance    End of Session PT - End of Session Equipment Utilized During Treatment: Gait belt Activity Tolerance: Patient tolerated treatment well Patient left: in  chair;with call bell/phone within reach;with family/visitor present Nurse Communication: Mobility status  GP     Brian Salazar 09/17/2012, 1:24 PM

## 2012-09-18 LAB — CBC
Platelets: 171 10*3/uL (ref 150–400)
RDW: 13.3 % (ref 11.5–15.5)
WBC: 8.3 10*3/uL (ref 4.0–10.5)

## 2012-09-18 NOTE — Plan of Care (Signed)
Problem: Discharge Progression Outcomes Goal: Incision without S/S infection Outcome: Completed/Met Date Met:  09/18/12 dsg with no drainage noted. Surgical dsg remains in place

## 2012-09-18 NOTE — Evaluation (Signed)
Occupational Therapy Evaluation Patient Details Name: Brian Salazar MRN: [id number] DOB: 01-26-1955 Today's Date: 09/18/2012 Time: 4098-1191 OT Time Calculation (min): 22 min  OT Assessment / Plan / Recommendation History of present illness     Clinical Impression   Pt was admitted for R anterior direct THA. All education was completed, and pt does not need any further OT at this time.      OT Assessment  Patient does not need any further OT services    Follow Up Recommendations  No OT follow up    Barriers to Discharge      Equipment Recommendations  None recommended by OT    Recommendations for Other Services    Frequency       Precautions / Restrictions Precautions Precautions: Fall Restrictions Other Position/Activity Restrictions: WBAT   Pertinent Vitals/Pain 5/10 thigh, R.  Repositioned in chair    ADL  Grooming: Wash/dry hands;Wash/dry face;Supervision/safety Where Assessed - Grooming: Supported standing Toilet Transfer: Radiographer, therapeutic Method: Sit to Barista: Comfort height toilet;Grab bars Equipment Used: Rolling walker Transfers/Ambulation Related to ADLs: pt ambulated to bathroom with supervision; self-corrected sequence.  Pt confident:cued to keep walker in front of him at all times for optimal safety.  Pt wants to try standard commode at home.  He has a sink and wall to help him up and should be OK ADL Comments: Pt performs UB adls with set up and LB with min A.  Educated to respect pain and let himself heal.  He is very independent.   Pt may go to mother's for shower (walk in).  Reviewed tub readiness.   OT Diagnosis:    OT Problem List:   OT Treatment Interventions:     OT Goals(Current goals can be found in the care plan section)    Visit Information  Last OT Received On: 09/18/12 Assistance Needed: +1       Prior Functioning     Home Living Family/patient expects to be discharged to:: Private  residence Prior Function Level of Independence: Independent Communication Communication: No difficulties         Vision/Perception     Cognition  Cognition Arousal/Alertness: Awake/alert Behavior During Therapy: WFL for tasks assessed/performed Overall Cognitive Status: Within Functional Limits for tasks assessed    Extremity/Trunk Assessment Upper Extremity Assessment Upper Extremity Assessment: Overall WFL for tasks assessed     Mobility Bed Mobility Supine to Sit: 4: Min assist;HOB elevated;With rails Transfers Sit to Stand: 5: Supervision;From bed Stand to Sit: 5: Supervision Details for Transfer Assistance: pt keeps leg under him; prefers not to stretch out for comfort     Exercise     Balance     End of Session OT - End of Session Activity Tolerance: Patient tolerated treatment well Patient left: in chair;with call bell/phone within reach;with family/visitor present  GO     Brian Salazar 09/18/2012, 8:44 AM Marica Otter, OTR/L [phone] 09/18/2012

## 2012-09-18 NOTE — Discharge Summary (Signed)
Physician Discharge Summary  Patient ID: Brian Salazar MRN: [id number] DOB/AGE: [birth year] 55 y.o.  Admit date: 09/16/2012 Discharge date: 09/18/2012  Admission Diagnoses:  Degenerative arthritis of hip  Discharge Diagnoses:  Principal Problem:   Degenerative arthritis of hip, right   Past Medical History  Diagnosis Date  . Hypertension   . Shortness of breath     with exertion  . GERD (gastroesophageal reflux disease)     occas  . Arthritis     OA RIGHT HIP    Surgeries: Procedure(s): RIGHT TOTAL HIP ARTHROPLASTY ANTERIOR APPROACH on 09/16/2012   Consultants (if any):    Discharged Condition: Improved  Hospital Course: Brian Salazar is an 57 y.o. male who was admitted 09/16/2012 with a diagnosis of Degenerative arthritis of hip and went to the operating room on 09/16/2012 and underwent the above named procedures.    He was given perioperative antibiotics:  Anti-infectives   Start     Dose/Rate Route Frequency Ordered Stop   09/16/12 1800  ceFAZolin (ANCEF) IVPB 1 g/50 mL premix     1 g 100 mL/hr over 30 Minutes Intravenous Every 6 hours 09/16/12 1643 09/17/12 0030   09/16/12 1100  ceFAZolin (ANCEF) IVPB 2 g/50 mL premix     2 g 100 mL/hr over 30 Minutes Intravenous On call to O.R. 09/16/12 0957 09/16/12 1230    .  He was given sequential compression devices, early ambulation, and aspirin for DVT prophylaxis.  He benefited maximally from the hospital stay and there were no complications.    Recent vital signs:  Filed Vitals:   09/18/12 0605  BP: 130/83  Pulse: 90  Temp: 98.4 F (36.9 C)  Resp: 16    Recent laboratory studies:  Lab Results  Component Value Date   HGB 13.2 09/18/2012   HGB 13.0 09/17/2012   HGB 15.4 09/09/2012   Lab Results  Component Value Date   WBC 8.3 09/18/2012   PLT 171 09/18/2012   Lab Results  Component Value Date   INR 0.98 09/09/2012   Lab Results  Component Value Date   NA 135 09/17/2012   K 4.2 09/17/2012   CL 101  09/17/2012   CO2 28 09/17/2012   BUN 14 09/17/2012   CREATININE 0.97 09/17/2012   GLUCOSE 118* 09/17/2012    Discharge Medications:     Medication List    STOP taking these medications       ibuprofen 200 MG tablet  Commonly known as:  ADVIL,MOTRIN      TAKE these medications       aspirin 325 MG EC tablet  Take 1 tablet (325 mg total) by mouth 2 (two) times daily after a meal.     lisinopril-hydrochlorothiazide 20-12.5 MG per tablet  Commonly known as:  PRINZIDE,ZESTORETIC  Take 1 tablet by mouth daily at 12 noon.     methocarbamol 500 MG tablet  Commonly known as:  ROBAXIN  Take 1 tablet (500 mg total) by mouth every 6 (six) hours as needed.     naproxen sodium 220 MG tablet  Commonly known as:  ANAPROX  Take 220 mg by mouth 2 (two) times daily as needed (for pain).     omeprazole 20 MG capsule  Commonly known as:  PRILOSEC  Take 20 mg by mouth daily as needed.     oxyCODONE-acetaminophen 5-325 MG per tablet  Commonly known as:  ROXICET  Take 1-2 tablets by mouth every 4 (four) hours as needed for pain.  Diagnostic Studies: Dg Chest 2 View  09/09/2012   *RADIOLOGY REPORT*  Clinical Data: Preop for right total hip replacement  CHEST - 2 VIEW  Comparison: None.  Findings: Cardiomediastinal silhouette is unremarkable.  No acute infiltrate or pleural effusion.  No pulmonary edema.  Bony thorax is unremarkable.  IMPRESSION: No active disease.   Original Report Authenticated By: Natasha Mead, M.D.   Dg Hip Complete Right  09/16/2012   *RADIOLOGY REPORT*  Clinical Data: Right total hip arthroplasty.  RIGHT HIP - COMPLETE 2+ VIEW  Comparison: No priors.  Findings: Two intraoperative spot films of the right hip demonstrate placement of a right total hip prosthesis.  The femoral and acetabular components of the prosthesis appear properly located on these limited views.  No definite periprostatic fracture is identified on this fluoroscopic spot film.  Prosthetic femoral head  projects within the prosthetic acetabulum on these two views.  IMPRESSION: 1.  Intraoperative documentation of right total hip arthroplasty, as above, without definite acute complicating features.   Original Report Authenticated By: Trudie Reed, M.D.   Dg Pelvis Portable  09/16/2012   *RADIOLOGY REPORT*  Clinical Data: Postop right hip replacement  PORTABLE PELVIS  Comparison: Intraoperative fluoroscopic radiographs dated 09/16/2012  Findings: Right hip arthroplasty in satisfactory position on this single frontal view.  No fracture or dislocation is seen.  IMPRESSION: Right total hip arthroplasty in satisfactory position.   Original Report Authenticated By: Charline Bills, M.D.   Dg Hip Portable 1 View Right  09/16/2012   *RADIOLOGY REPORT*  Clinical Data: Postop right hip replacement  PORTABLE RIGHT HIP - 1 VIEW  Comparison: Intraoperative fluoroscopic radiographs dated 09/16/2012  Findings: Right total hip arthroplasty in satisfactory position on this single lateral view.  Associated subcutaneous gas.  IMPRESSION: Right total hip arthroplasty in satisfactory position.   Original Report Authenticated By: Charline Bills, M.D.   Dg C-arm 1-60 Min-no Report  09/16/2012   CLINICAL DATA: Right total hip anterior   C-ARM 1-60 MINUTES  Fluoroscopy was utilized by the requesting physician.  No radiographic  interpretation.     Disposition: Final discharge disposition not confirmed        Follow-up Information   Follow up with Kathryne Hitch, MD In 2 weeks.   Specialty:  Orthopedic Surgery   Contact information:   [address] [place] [address] [phone]        Signed: Cheral Almas 09/18/2012, 8:31 AM

## 2012-09-18 NOTE — Progress Notes (Signed)
Pt discharged to home. DC instructions given with wife at bedside. Prescriptions x 3 given. No concerns voiced. Left unit in wheelchair pushed by nurse tech. Left in good condition to meet wife in car downstairs. Vwilliams,rn.

## 2012-09-18 NOTE — Progress Notes (Signed)
Physical Therapy Treatment Patient Details Name: Brian Salazar MRN: [id number] DOB: Jun 16, 1955 Today's Date: 09/18/2012 Time: 6295-2841 PT Time Calculation (min): 53 min  PT Assessment / Plan / Recommendation  History of Present Illness     PT Comments   Pt and spouse educated on stairs and car transfers.  Pt motivated and progressing well with increased time all tasks with pt requesting multiple rest breaks to stretch.  Follow Up Recommendations  Home health PT     Does the patient have the potential to tolerate intense rehabilitation     Barriers to Discharge        Equipment Recommendations  None recommended by PT    Recommendations for Other Services OT consult  Frequency 7X/week   Progress towards PT Goals Progress towards PT goals: Goals met/education completed, patient discharged from PT  Plan Current plan remains appropriate    Precautions / Restrictions Precautions Precautions: Fall Restrictions Weight Bearing Restrictions: No Other Position/Activity Restrictions: WBAT   Pertinent Vitals/Pain 3/10; premed, ice pack provided    Mobility  Bed Mobility Bed Mobility: Sit to Supine Sit to Supine: 4: Min assist Details for Bed Mobility Assistance: Pt attempted crossed ankle to assist R LE with L but R knee would not tolerate.  Attemted  use of belt to assist and unable to complete 2* obesity.  Pt assisted to supine with VC and assist with R LE management Transfers Transfers: Sit to Stand;Stand to Sit Sit to Stand: From bed;From chair/3-in-1;4: Min guard Stand to Sit: 5: Supervision Details for Transfer Assistance: cues for use of LEs to self assist Ambulation/Gait Ambulation/Gait Assistance: 4: Min guard;5: Supervision Ambulation Distance (Feet): 369 Feet Assistive device: Rolling walker Ambulation/Gait Assistance Details: cues for posture and position from RW Gait Pattern: Step-to pattern;Step-through pattern;Shuffle;Antalgic Gait velocity: slow General Gait  Details: multiple standing rest breaks required for task completion Stairs: Yes Stairs Assistance: 4: Min assist Stairs Assistance Details (indicate cue type and reason): cues for sequence, foot and crutch placement Stair Management Technique: One rail Left;Forwards;With crutches;Step to pattern Number of Stairs: 4    Exercises Total Joint Exercises Ankle Circles/Pumps: AROM;Both;15 reps;Supine Quad Sets: AROM;Both;10 reps;Supine Heel Slides: AAROM;Right;Supine;20 reps Hip ABduction/ADduction: AAROM;Right;Supine;20 reps   PT Diagnosis:    PT Problem List:   PT Treatment Interventions:     PT Goals (current goals can now be found in the care plan section) Acute Rehab PT Goals Patient Stated Goal: Resume previous lifestyle with decreased pain PT Goal Formulation: With patient Time For Goal Achievement: 09/24/12 Potential to Achieve Goals: Good  Visit Information  Last PT Received On: 09/18/12 Assistance Needed: +1    Subjective Data  Subjective: Wait just let me stop and stretch Patient Stated Goal: Resume previous lifestyle with decreased pain   Cognition  Cognition Arousal/Alertness: Awake/alert Behavior During Therapy: WFL for tasks assessed/performed Overall Cognitive Status: Within Functional Limits for tasks assessed    Balance     End of Session PT - End of Session Activity Tolerance: Patient tolerated treatment well Patient left: in bed;with call bell/phone within reach;with family/visitor present Nurse Communication: Mobility status   GP     [name] 09/18/2012, 12:31 PM

## 2012-09-20 ENCOUNTER — Encounter (HOSPITAL_COMMUNITY): Payer: Self-pay | Admitting: Orthopaedic Surgery

## 2012-09-20 NOTE — Anesthesia Postprocedure Evaluation (Signed)
Anesthesia Post Note  Patient: Brian Salazar  Procedure(s) Performed: Procedure(s) (LRB): RIGHT TOTAL HIP ARTHROPLASTY ANTERIOR APPROACH (Right)  Anesthesia type: Spinal  Patient location: PACU  Post pain: Pain level controlled  Post assessment: Post-op Vital signs reviewed  Last Vitals: BP 130/83  Pulse 90  Temp(Src) 36.9 C (Oral)  Resp 16  Ht 5\' 6"  (1.676 m)  Wt 248 lb (112.492 kg)  BMI 40.05 kg/m2  SpO2 97%  Post vital signs: Reviewed  Level of consciousness: sedated  Complications: No apparent anesthesia complications

## 2013-06-20 ENCOUNTER — Encounter: Payer: Self-pay | Admitting: Gastroenterology

## 2013-07-07 ENCOUNTER — Telehealth: Payer: Self-pay

## 2013-07-10 ENCOUNTER — Other Ambulatory Visit: Payer: Self-pay

## 2013-07-10 DIAGNOSIS — Z1211 Encounter for screening for malignant neoplasm of colon: Secondary | ICD-10-CM

## 2013-07-10 NOTE — Telephone Encounter (Signed)
Gastroenterology Pre-Procedure Review  Request Date: 07/07/2013 Requesting Physician: WAS IN RECALL  PATIENT REVIEW QUESTIONS: The patient responded to the following health history questions as indicated:    1. Diabetes Melitis: no 2. Joint replacements in the past 12 months: RIGHT HIP REPLACEMENT IN 08/2012 3. Major health problems in the past 3 months: no 4. Has an artificial valve or MVP: no 5. Has a defibrillator: no 6. Has been advised in past to take antibiotics in advance of a procedure like teeth cleaning: yes    MEDICATIONS & ALLERGIES:    Patient reports the following regarding taking any blood thinners:   Plavix? no Aspirin? no Coumadin? no  Patient confirms/reports the following medications:  Current Outpatient Prescriptions  Medication Sig Dispense Refill  . lisinopril-hydrochlorothiazide (PRINZIDE,ZESTORETIC) 20-12.5 MG per tablet Take 1 tablet by mouth daily at 12 noon.      Marland Kitchen aspirin EC 325 MG EC tablet Take 1 tablet (325 mg total) by mouth 2 (two) times daily after a meal.  30 tablet  0   No current facility-administered medications for this visit.    Patient confirms/reports the following allergies:  Allergies  Allergen Reactions  . Simvastatin     Made me feel funny    No orders of the defined types were placed in this encounter.    AUTHORIZATION INFORMATION Primary Insurance:   ID #:  Group #:  Pre-Cert / Auth required Pre-Cert / Auth #:   Secondary Insurance:   ID #:  Group #:  Pre-Cert / Auth required: Pre-Cert / Auth #:   SCHEDULE INFORMATION: Procedure has been scheduled as follows:  Date: 07/28/2013             Time:  10:45 am Location:   This Gastroenterology Pre-Precedure Review Form is being routed to the following provider(s): Barney Drain, MD

## 2013-07-11 MED ORDER — SOD PICOSULFATE-MAG OX-CIT ACD 10-3.5-12 MG-GM-GM PO PACK: 1.0000 | PACK | ORAL | Status: DC

## 2013-07-11 NOTE — Telephone Encounter (Signed)
Rx sent to the pharmacy and instructions mailed to pt.  

## 2013-07-11 NOTE — Telephone Encounter (Signed)
PREPOPIK-DRINK WATER TO KEEP URINE LIGHT YELLOW.  PT SHOULD DROP OFF RX 3 DAYS PRIOR TO PROCEDURE.  

## 2013-07-14 ENCOUNTER — Encounter (HOSPITAL_COMMUNITY): Payer: Self-pay | Admitting: Pharmacy Technician

## 2013-07-25 ENCOUNTER — Telehealth: Payer: Self-pay

## 2013-07-25 NOTE — Telephone Encounter (Signed)
I called [employer] at [phone] and was told that procedures that were done as outpatient do not require a PA/

## 2013-07-28 ENCOUNTER — Ambulatory Visit (HOSPITAL_COMMUNITY)
Admission: RE | Admit: 2013-07-28 | Discharge: 2013-07-28 | Disposition: A | Discharge status: Home / Self Care | Payer: Managed Care, Other (non HMO) | Source: Ambulatory Visit | Attending: Gastroenterology | Admitting: Gastroenterology

## 2013-07-28 ENCOUNTER — Encounter (HOSPITAL_COMMUNITY): Payer: Self-pay | Admitting: *Deleted

## 2013-07-28 ENCOUNTER — Encounter (HOSPITAL_COMMUNITY)
Admission: RE | Disposition: A | Discharge status: Home / Self Care | Payer: Self-pay | Source: Ambulatory Visit | Attending: Gastroenterology

## 2013-07-28 DIAGNOSIS — D126 Benign neoplasm of colon, unspecified: Secondary | ICD-10-CM

## 2013-07-28 DIAGNOSIS — I10 Essential (primary) hypertension: Secondary | ICD-10-CM | POA: Insufficient documentation

## 2013-07-28 DIAGNOSIS — D129 Benign neoplasm of anus and anal canal: Secondary | ICD-10-CM

## 2013-07-28 DIAGNOSIS — K648 Other hemorrhoids: Secondary | ICD-10-CM | POA: Insufficient documentation

## 2013-07-28 DIAGNOSIS — K219 Gastro-esophageal reflux disease without esophagitis: Secondary | ICD-10-CM | POA: Insufficient documentation

## 2013-07-28 DIAGNOSIS — Z1211 Encounter for screening for malignant neoplasm of colon: Secondary | ICD-10-CM

## 2013-07-28 DIAGNOSIS — Z8371 Family history of colonic polyps: Secondary | ICD-10-CM | POA: Insufficient documentation

## 2013-07-28 DIAGNOSIS — K5730 Diverticulosis of large intestine without perforation or abscess without bleeding: Secondary | ICD-10-CM | POA: Insufficient documentation

## 2013-07-28 DIAGNOSIS — D128 Benign neoplasm of rectum: Secondary | ICD-10-CM | POA: Insufficient documentation

## 2013-07-28 DIAGNOSIS — Z83719 Family history of colon polyps, unspecified: Secondary | ICD-10-CM | POA: Insufficient documentation

## 2013-07-28 DIAGNOSIS — Z96649 Presence of unspecified artificial hip joint: Secondary | ICD-10-CM | POA: Insufficient documentation

## 2013-07-28 DIAGNOSIS — Z800 Family history of malignant neoplasm of digestive organs: Secondary | ICD-10-CM | POA: Insufficient documentation

## 2013-07-28 DIAGNOSIS — Z79899 Other long term (current) drug therapy: Secondary | ICD-10-CM | POA: Insufficient documentation

## 2013-07-28 HISTORY — PX: BIOPSY: SHX5522

## 2013-07-28 HISTORY — PX: COLONOSCOPY: SHX5424

## 2013-07-28 HISTORY — PX: POLYPECTOMY: SHX5525

## 2013-07-28 SURGERY — COLONOSCOPY
Anesthesia: Moderate Sedation

## 2013-07-28 MED ORDER — MIDAZOLAM HCL 5 MG/5ML IJ SOLN
INTRAMUSCULAR | Status: AC
  Filled 2013-07-28: qty 10

## 2013-07-28 MED ORDER — STERILE WATER FOR IRRIGATION IR SOLN
Status: DC | PRN
  Administered 2013-07-28: 11:00:00

## 2013-07-28 MED ORDER — MEPERIDINE HCL 100 MG/ML IJ SOLN
INTRAMUSCULAR | Status: DC | PRN
  Administered 2013-07-28: 50 mg via INTRAVENOUS
  Administered 2013-07-28: 25 mg via INTRAVENOUS

## 2013-07-28 MED ORDER — MEPERIDINE HCL 100 MG/ML IJ SOLN
INTRAMUSCULAR | Status: AC
  Filled 2013-07-28: qty 2

## 2013-07-28 MED ORDER — MIDAZOLAM HCL 5 MG/5ML IJ SOLN
INTRAMUSCULAR | Status: DC | PRN
  Administered 2013-07-28 (×3): 2 mg via INTRAVENOUS

## 2013-07-28 MED ORDER — SODIUM CHLORIDE 0.9 % IV SOLN
INTRAVENOUS | Status: DC
  Administered 2013-07-28: 10:00:00 via INTRAVENOUS

## 2013-07-28 NOTE — Op Note (Signed)
[facility] [address] [place], [address]   COLONOSCOPY PROCEDURE REPORT  PATIENT: Brian Salazar, Brian Salazar  MR#: [id number] BIRTHDATE: 04-Apr-1955 , 41  yrs. old GENDER: Male ENDOSCOPIST: Barney Drain, MD REFERRED [id number] Wolfgang Phoenix, M.D. PROCEDURE DATE:  07/28/2013 PROCEDURE:   Colonoscopy with snare  AND WITH cold biopsy polypectomy /ENDOCUFF INDICATIONS:Colorectal cancer screening.  PMHx; SIMPLE ADENOMA, FAMHx; FATHER COLON CA AGE < 60 MEDICATIONS: Demerol 75 mg IV and Versed 6 mg IV  DESCRIPTION OF PROCEDURE:    Physical exam was performed.  Informed consent was obtained from the patient after explaining the benefits, risks, and alternatives to procedure.  The patient was connected to monitor and placed in left lateral position. Continuous oxygen was provided by nasal cannula and IV medicine administered through an indwelling cannula.  After administration of sedation and rectal exam, the patients rectum was intubated and the EC-3890Li ([id number])  colonoscope was advanced under direct visualization to the cecum.  The scope was removed slowly by carefully examining the color, texture, anatomy, and integrity mucosa on the way out.  The patient was recovered in endoscopy and discharged home in satisfactory condition.    COLON FINDINGS: Two sessile polyps measuring 6-7 mm in size were found at the hepatic flexure and in the descending colon.  A polypectomy was performed with cold forceps and using snare cautery.  ONE 2 MM SESSILE RECTAL  POLYP REMOVED VIA COLD FORCEPS. PAN-COLONIC DIVERTICULOSIS(TC--> [place]). SMALL INTERNAL HEMORRHOIDS. LARGE EXTERNAL HEMORRHOIDS.  PREP QUALITY: good. CECAL W/D TIME: 20 minutes     COMPLICATIONS: None  ENDOSCOPIC IMPRESSION: 3 COLON POLYPS REMOVED MODERATE DIVERTICULOSIS SMALL INTERNAL HEMORRHOIDS   RECOMMENDATIONS: FOLLOW A HIGH FIBER DIET.  AVOID ITEMS THAT CAUSE BLOATING. BIOPSY RESULTS SHOULD BE BACK IN 7 DAYS.  Next  colonoscopy in 5 years. CALL WITH QUESTIONS OR CONCERNS about the umbilical hernia. SYMPTOMS/MANAGMENT OF INCARCERATION DISCUSSED.       _______________________________ Lorrin MaisBarney Drain, MD 07/28/2013 11:57 AM

## 2013-07-28 NOTE — H&P (Signed)
  Primary Care Physician:  Sallee Lange, MD Primary Gastroenterologist:  Dr. Oneida Alar  Pre-Procedure History & Physical: HPI:  Brian Salazar is a 58 y.o. male here for [place].  Past Medical History  Diagnosis Date  . Hypertension   . Shortness of breath     with exertion  . GERD (gastroesophageal reflux disease)     occas  . Arthritis     OA RIGHT HIP    Past Surgical History  Procedure Laterality Date  . Colonoscopy    . Total hip arthroplasty Right 09/16/2012    Procedure: RIGHT TOTAL HIP ARTHROPLASTY ANTERIOR APPROACH;  Surgeon: Mcarthur Rossetti, MD;  Location: [facility];  Service: Orthopedics;  Laterality: Right;  Right Total Hip Arthroplasty, Anterior Approach    Prior to Admission medications   Medication Sig Start Date End Date Taking? Authorizing Provider  lisinopril-hydrochlorothiazide (PRINZIDE,ZESTORETIC) 20-12.5 MG per tablet Take 1 tablet by mouth daily at 12 noon.   Yes Historical Provider, MD    Allergies as of 07/10/2013 - Review Complete 07/07/2013  Allergen Reaction Noted  . Simvastatin  07/13/2012    History reviewed. No pertinent family history.  History   Social History  . Marital Status: Married    Spouse Name: N/A    Number of Children: N/A  . Years of Education: N/A   Occupational History  . Not on file.   Social History Main Topics  . Smoking status: Never Smoker   . Smokeless tobacco: Never Used  . Alcohol Use: Yes     Comment: RARE ALCOHOL  . Drug Use: No  . Sexual Activity: Not on file   Other Topics Concern  . Not on file   Social History Narrative  . No narrative on file    Review of Systems: See HPI, otherwise negative ROS   Physical Exam: BP 136/84  Pulse 67  Temp(Src) 97.4 F (36.3 C) (Oral)  Resp 18  SpO2 96% General:   Alert,  pleasant and cooperative in NAD Head:  Normocephalic and atraumatic. Neck:  Supple; Lungs:  Clear throughout to auscultation.    Heart:  Regular rate and  rhythm. Abdomen:  Soft, nontender and nondistended. Normal bowel sounds, without guarding, and without rebound.   Neurologic:  Alert and  oriented x4;  grossly normal neurologically.  Impression/Plan:     SCREENING  Plan:  1. TCS TODAY. DISCUSSED PROCEDURE, BENEFITS, AND RISKS.

## 2013-07-28 NOTE — Discharge Instructions (Signed)
You had 3 small polyps removed. You have small internal hemorrhoids, large external hemorrhoids, and diverticulosis IN YOUR RIGHT AND LEFT COLON.   FOLLOW A HIGH FIBER DIET. AVOID ITEMS THAT CAUSE BLOATING. SEE INFO BELOW.  YOUR BIOPSY RESULTS SHOULD BE BACK IN 7 DAYS.  Next colonoscopy in 5 years.  CALL WITH QUESTIONS OR CONCERNS about your umbilical hernia.   Colonoscopy Care After Read the instructions outlined below and refer to this sheet in the next week. These discharge instructions provide you with general information on caring for yourself after you leave the hospital. While your treatment has been planned according to the most current medical practices available, unavoidable complications occasionally occur. If you have any problems or questions after discharge, call DR. Haileigh Pitz, [phone].  ACTIVITY  You may resume your regular activity, but move at a slower pace for the next 24 hours.   Take frequent rest periods for the next 24 hours.   Walking will help get rid of the air and reduce the bloated feeling in your belly (abdomen).   No driving for 24 hours (because of the medicine (anesthesia) used during the test).   You may shower.   Do not sign any important legal documents or operate any machinery for 24 hours (because of the anesthesia used during the test).    NUTRITION  Drink plenty of fluids.   You may resume your normal diet as instructed by your doctor.   Begin with a light meal and progress to your normal diet. Heavy or fried foods are harder to digest and may make you feel sick to your stomach (nauseated).   Avoid alcoholic beverages for 24 hours or as instructed.    MEDICATIONS  You may resume your normal medications.   WHAT YOU CAN EXPECT TODAY  Some feelings of bloating in the abdomen.   Passage of more gas than usual.   Spotting of blood in your stool or on the toilet paper  .  IF YOU HAD POLYPS REMOVED DURING THE COLONOSCOPY:  Eat  a soft diet IF YOU HAVE NAUSEA, BLOATING, ABDOMINAL PAIN, OR VOMITING.    FINDING OUT THE RESULTS OF YOUR TEST Not all test results are available during your visit. DR. Oneida Alar WILL CALL YOU WITHIN 7 DAYS OF YOUR PROCEDUE WITH YOUR RESULTS. Do not assume everything is normal if you have not heard from DR. Ein Rijo IN ONE WEEK, CALL HER OFFICE AT [phone].  SEEK IMMEDIATE MEDICAL ATTENTION AND CALL THE OFFICE: [phone] IF:  You have more than a spotting of blood in your stool.   Your belly is swollen (abdominal distention).   You are nauseated or vomiting.   You have a temperature over 101F.   You have abdominal pain or discomfort that is severe or gets worse throughout the day.  Polyps, Colon  A polyp is extra tissue that grows inside your body. Colon polyps grow in the large intestine. The large intestine, also called the colon, is part of your digestive system. It is a long, hollow tube at the end of your digestive tract where your body makes and stores stool. Most polyps are not dangerous. They are benign. This means they are not cancerous. But over time, some types of polyps can turn into cancer. Polyps that are smaller than a pea are usually not harmful. But larger polyps could someday become or may already be cancerous. To be safe, doctors remove all polyps and test them.   WHO GETS POLYPS? Anyone can get polyps,  but certain people are more likely than others. You may have a greater chance of getting polyps if:  You are over 50.   You have had polyps before.   Someone in your family has had polyps.   Someone in your family has had cancer of the large intestine.   Find out if someone in your family has had polyps. You may also be more likely to get polyps if you:   Eat a lot of fatty foods   Smoke   Drink alcohol   Do not exercise  Eat too much   TREATMENT  The caregiver will remove the polyp during sigmoidoscopy or colonoscopy.  PREVENTION There is not one sure  way to prevent polyps. You might be able to lower your risk of getting them if you:  Eat more fruits and vegetables and less fatty food.   Do not smoke.   Avoid alcohol.   Exercise every day.   Lose weight if you are overweight.   Eating more calcium and folate can also lower your risk of getting polyps. Some foods that are rich in calcium are milk, cheese, and broccoli. Some foods that are rich in folate are chickpeas, kidney beans, and spinach.   High-Fiber Diet A high-fiber diet changes your normal diet to include more whole grains, legumes, fruits, and vegetables. Changes in the diet involve replacing refined carbohydrates with unrefined foods. The calorie level of the diet is essentially unchanged. The Dietary Reference Intake (recommended amount) for adult males is 38 grams per day. For adult females, it is 25 grams per day. Pregnant and lactating women should consume 28 grams of fiber per day. Fiber is the intact part of a plant that is not broken down during digestion. Functional fiber is fiber that has been isolated from the plant to provide a beneficial effect in the body. PURPOSE  Increase stool bulk.   Ease and regulate bowel movements.   Lower cholesterol.  INDICATIONS THAT YOU NEED MORE FIBER  Constipation and hemorrhoids.   Uncomplicated diverticulosis (intestine condition) and irritable bowel syndrome.   Weight management.   As a protective measure against hardening of the arteries (atherosclerosis), diabetes, and cancer.   GUIDELINES FOR INCREASING FIBER IN THE DIET  Start adding fiber to the diet slowly. A gradual increase of about 5 more grams (2 slices of whole-wheat bread, 2 servings of most fruits or vegetables, or 1 bowl of high-fiber cereal) per day is best. Too rapid an increase in fiber may result in constipation, flatulence, and bloating.   Drink enough water and fluids to keep your urine clear or pale yellow. Water, juice, or caffeine-free drinks are  recommended. Not drinking enough fluid may cause constipation.   Eat a variety of high-fiber foods rather than one type of fiber.   Try to increase your intake of fiber through using high-fiber foods rather than fiber pills or supplements that contain small amounts of fiber.   The goal is to change the types of food eaten. Do not supplement your present diet with high-fiber foods, but replace foods in your present diet.  INCLUDE A VARIETY OF FIBER SOURCES  Replace refined and processed grains with whole grains, canned fruits with fresh fruits, and incorporate other fiber sources. White rice, white breads, and most bakery goods contain little or no fiber.   Brown whole-grain rice, buckwheat oats, and many fruits and vegetables are all good sources of fiber. These include: broccoli, Brussels sprouts, cabbage, cauliflower, beets, sweet potatoes, white  potatoes (skin on), carrots, tomatoes, eggplant, squash, berries, fresh fruits, and dried fruits.   Cereals appear to be the richest source of fiber. Cereal fiber is found in whole grains and bran. Bran is the fiber-rich outer coat of cereal grain, which is largely removed in refining. In whole-grain cereals, the bran remains. In breakfast cereals, the largest amount of fiber is found in those with "bran" in their names. The fiber content is sometimes indicated on the label.   You may need to include additional fruits and vegetables each day.   In baking, for 1 cup white flour, you may use the following substitutions:   1 cup whole-wheat flour minus 2 tablespoons.   1/2 cup white flour plus 1/2 cup whole-wheat flour.   Diverticulosis Diverticulosis is a common condition that develops when small pouches (diverticula) form in the wall of the colon. The risk of diverticulosis increases with age. It happens more often in people who eat a low-fiber diet. Most individuals with diverticulosis have no symptoms. Those individuals with symptoms usually  experience belly (abdominal) pain, constipation, or loose stools (diarrhea).  HOME CARE INSTRUCTIONS  Increase the amount of fiber in your diet as directed by your caregiver or dietician. This may reduce symptoms of diverticulosis.   Drink at least 6 to 8 glasses of water each day to prevent constipation.   Try not to strain when you have a bowel movement.   Avoiding nuts and seeds to prevent complications is still an uncertain benefit.       FOODS HAVING HIGH FIBER CONTENT INCLUDE:  Fruits. Apple, peach, pear, tangerine, raisins, prunes.   Vegetables. Brussels sprouts, asparagus, broccoli, cabbage, carrot, cauliflower, romaine lettuce, spinach, summer squash, tomato, winter squash, zucchini.   Starchy Vegetables. Baked beans, kidney beans, lima beans, split peas, lentils, potatoes (with skin).   Grains. Whole wheat bread, brown rice, bran flake cereal, plain oatmeal, white rice, shredded wheat, bran muffins.    SEEK IMMEDIATE MEDICAL CARE IF:  You develop increasing pain or severe bloating.   You have an oral temperature above 101F.   You develop vomiting or bowel movements that are bloody or black.   Hemorrhoids Hemorrhoids are dilated (enlarged) veins around the rectum. Sometimes clots will form in the veins. This makes them swollen and painful. These are called thrombosed hemorrhoids. Causes of hemorrhoids include:  Constipation.   Straining to have a bowel movement.   HEAVY LIFTING HOME CARE INSTRUCTIONS  Eat a well balanced diet and drink 6 to 8 glasses of water every day to avoid constipation. You may also use a bulk laxative.   Avoid straining to have bowel movements.   Keep anal area dry and clean.   Do not use a donut shaped pillow or sit on the toilet for long periods. This increases blood pooling and pain.   Move your bowels when your body has the urge; this will require less straining and will decrease pain and pressure.

## 2013-07-31 ENCOUNTER — Encounter (HOSPITAL_COMMUNITY): Payer: Self-pay | Admitting: Gastroenterology

## 2013-08-11 ENCOUNTER — Telehealth: Payer: Self-pay | Admitting: Gastroenterology

## 2013-08-11 NOTE — Telephone Encounter (Signed)
Please call pt. She had A HYPERPLASTIC POLYP removed.   FOLLOW A HIGH FIBER DIET. AVOID ITEMS THAT CAUSE BLOATING.  Next colonoscopy in 5 years.

## 2013-08-14 NOTE — Telephone Encounter (Signed)
Reminder in EPIC 

## 2013-08-15 NOTE — Telephone Encounter (Signed)
Tried to call pt. Many rings and no answer. Mailed letter to call.

## 2013-10-13 ENCOUNTER — Other Ambulatory Visit: Payer: Self-pay | Admitting: Family Medicine

## 2013-10-20 ENCOUNTER — Telehealth: Payer: Self-pay | Admitting: Family Medicine

## 2013-10-20 NOTE — Telephone Encounter (Signed)
Please send the patient a letter that he is due for lab work and a office visit. This is very important for his health.

## 2013-11-17 ENCOUNTER — Encounter: Payer: Self-pay | Admitting: Family Medicine

## 2013-11-17 ENCOUNTER — Ambulatory Visit (INDEPENDENT_AMBULATORY_CARE_PROVIDER_SITE_OTHER): Payer: Managed Care, Other (non HMO) | Admitting: Family Medicine

## 2013-11-17 VITALS — BP 122/78 | Ht 66.0 in | Wt 263.0 lb

## 2013-11-17 DIAGNOSIS — E785 Hyperlipidemia, unspecified: Secondary | ICD-10-CM

## 2013-11-17 DIAGNOSIS — I10 Essential (primary) hypertension: Secondary | ICD-10-CM

## 2013-11-17 DIAGNOSIS — Z125 Encounter for screening for malignant neoplasm of prostate: Secondary | ICD-10-CM

## 2013-11-17 MED ORDER — LISINOPRIL-HYDROCHLOROTHIAZIDE 20-12.5 MG PO TABS: ORAL_TABLET | ORAL | Status: DC

## 2013-11-17 NOTE — Progress Notes (Signed)
   Subjective:    Patient ID: Brian Salazar, male    DOB: 1955/03/09, 58 y.o.   MRN: [id number]  Hypertension This is a chronic problem. The current episode started more than 1 year ago. Risk factors for coronary artery disease include male gender and obesity. Treatments tried: lisinopril/hctz. There are no compliance problems.    Had a colonoscopy earlier this year.  He states he does not watch his diet doesn't excise as much as he should Review of Systems He denies chest tightness pressure pain shortness breath vomiting diarrhea bloody stools    Objective:   Physical Exam  Lungs clear heart regular abdomen soft extremities no edema skin warm dry patient defers prostate exam      Assessment & Plan:  Lab work order Blood pressure good control Continue medication Watch diet try to lose weight.

## 2014-06-11 ENCOUNTER — Telehealth: Payer: Self-pay | Admitting: Gastroenterology

## 2014-06-11 NOTE — Telephone Encounter (Signed)
Patient came into the front office this afternoon asking to see you about having a referral from a surgeon for his herniated umbilical  Hernia. You had seen him last summer and had recommendation him to see someone and he said it has gotten bigger. What do you recommend? He is trying to avoid coming for OV here. I told him your next available would be on 6/23 and he would be getting ready from vacation close to July 4 is was wanting to avoid having surgery around that time. Can you recommend a surgeon he can call and bypass doing a referral?  [phone]

## 2014-06-12 NOTE — Telephone Encounter (Signed)
PT NOT SEEN IN THE OFC SINCE 2010. HAD SCREENING TCS JUL 2015. HE WOULD NEED AN OPV IF HE WANTS A REFERRAL TO SURGERY FROM DR. Azariyah Luhrs. HE CAN TRY GET A REFERRAL FROM DR. Wolfgang Phoenix OR AND CALL TO GET AN APPT WITH DR. Arnoldo Morale WITHOUT ONE.

## 2014-06-19 ENCOUNTER — Telehealth: Payer: Self-pay

## 2014-06-19 NOTE — Telephone Encounter (Signed)
Pt is aware of Dr. Oneida Alar recommendations. States he will call [place] first and then call us back if he needs [place].

## 2014-06-21 NOTE — Telephone Encounter (Signed)
error 

## 2014-06-22 ENCOUNTER — Encounter: Payer: Self-pay | Admitting: Nurse Practitioner

## 2014-06-22 ENCOUNTER — Ambulatory Visit (INDEPENDENT_AMBULATORY_CARE_PROVIDER_SITE_OTHER): Payer: Managed Care, Other (non HMO) | Admitting: Nurse Practitioner

## 2014-06-22 VITALS — BP 142/92 | Temp 98.1°F | Ht 66.0 in | Wt 267.2 lb

## 2014-06-22 DIAGNOSIS — K429 Umbilical hernia without obstruction or gangrene: Secondary | ICD-10-CM | POA: Diagnosis not present

## 2014-06-22 NOTE — Progress Notes (Signed)
Subjective:  Presents requesting referral to general surgery for repair of umbilical hernia. Present x 2 years. No pain. Getting larger in size especially with exercise.   Objective:   BP 142/92 mmHg  Temp(Src) 98.1 F (36.7 C) (Oral)  Ht 5\' 6"  (1.676 m)  Wt 267 lb 4 oz (121.224 kg)  BMI 43.16 kg/m2 NAD. Alert, oriented. Large soft, reducible umbilical hernia noted. Non tender.   Assessment:  Problem List Items Addressed This Visit      Other   Umbilical hernia without obstruction and without gangrene - Primary   Relevant Orders   Ambulatory referral to General Surgery     Plan: refer to surgery; call back sooner if any problems.

## 2014-06-26 ENCOUNTER — Encounter: Payer: Self-pay | Admitting: Family Medicine

## 2014-07-17 ENCOUNTER — Other Ambulatory Visit: Payer: Self-pay | Admitting: Surgery

## 2014-12-24 ENCOUNTER — Other Ambulatory Visit: Payer: Self-pay | Admitting: Family Medicine

## 2015-02-02 ENCOUNTER — Other Ambulatory Visit: Payer: Self-pay | Admitting: Family Medicine

## 2015-02-04 NOTE — Telephone Encounter (Signed)
May give 14 days -needs office visit

## 2015-02-15 ENCOUNTER — Ambulatory Visit (INDEPENDENT_AMBULATORY_CARE_PROVIDER_SITE_OTHER): Payer: 59 | Admitting: Family Medicine

## 2015-02-15 ENCOUNTER — Encounter: Payer: Self-pay | Admitting: Family Medicine

## 2015-02-15 VITALS — BP 120/78 | Ht 66.0 in | Wt 263.0 lb

## 2015-02-15 DIAGNOSIS — I10 Essential (primary) hypertension: Secondary | ICD-10-CM | POA: Diagnosis not present

## 2015-02-15 DIAGNOSIS — Z125 Encounter for screening for malignant neoplasm of prostate: Secondary | ICD-10-CM | POA: Diagnosis not present

## 2015-02-15 DIAGNOSIS — Z13220 Encounter for screening for lipoid disorders: Secondary | ICD-10-CM

## 2015-02-15 MED ORDER — LISINOPRIL-HYDROCHLOROTHIAZIDE 20-12.5 MG PO TABS: 1.0000 | ORAL_TABLET | Freq: Every day | ORAL | Status: DC

## 2015-02-15 NOTE — Progress Notes (Signed)
   Subjective:    Patient ID: Brian Salazar, male    DOB: [birth year], 60 y.o.   MRN: [id number]  Hypertension This is a chronic problem. The current episode started more than 1 year ago. Pertinent negatives include no chest pain. Risk factors for coronary artery disease include post-menopausal state. Treatments tried: lisinopril/hctz. There are no compliance problems.       Review of Systems  Constitutional: Negative for activity change, appetite change and fatigue.  HENT: Negative for congestion.   Respiratory: Negative for cough.   Cardiovascular: Negative for chest pain.  Gastrointestinal: Negative for abdominal pain.  Endocrine: Negative for polydipsia and polyphagia.  Neurological: Negative for weakness.  Psychiatric/Behavioral: Negative for confusion.       Objective:   Physical Exam  Constitutional: He appears well-nourished. No distress.  Cardiovascular: Normal rate, regular rhythm and normal heart sounds.   No murmur heard. Pulmonary/Chest: Effort normal and breath sounds normal. No respiratory distress.  Musculoskeletal: He exhibits no edema.  Lymphadenopathy:    He has no cervical adenopathy.  Neurological: He is alert.  Psychiatric: His behavior is normal.  Vitals reviewed.         Assessment & Plan:   patient was counseled regarding exercise watching diet and try to get his weight better  He was incurred to do a wellness exam somewhere in the near future Encouraged to get his lab work completed  continue blood pressure medicine Stay physically active Watch diet closely  follow-up in one year

## 2015-02-15 NOTE — Patient Instructions (Signed)
DASH Eating Plan  DASH stands for "Dietary Approaches to Stop Hypertension." The DASH eating plan is a healthy eating plan that has been shown to reduce high blood pressure (hypertension). Additional health benefits may include reducing the risk of type 2 diabetes mellitus, heart disease, and stroke. The DASH eating plan may also help with weight loss.  WHAT DO I NEED TO KNOW ABOUT THE DASH EATING PLAN?  For the DASH eating plan, you will follow these general guidelines:  · Choose foods with a percent daily value for sodium of less than 5% (as listed on the food label).  · Use salt-free seasonings or herbs instead of table salt or sea salt.  · Check with your health care provider or pharmacist before using salt substitutes.  · Eat lower-sodium products, often labeled as "lower sodium" or "no salt added."  · Eat fresh foods.  · Eat more vegetables, fruits, and low-fat dairy products.  · Choose whole grains. Look for the word "whole" as the first word in the ingredient list.  · Choose fish and skinless chicken or turkey more often than red meat. Limit fish, poultry, and meat to 6 oz (170 g) each day.  · Limit sweets, desserts, sugars, and sugary drinks.  · Choose heart-healthy fats.  · Limit cheese to 1 oz (28 g) per day.  · Eat more home-cooked food and less restaurant, buffet, and fast food.  · Limit fried foods.  · Cook foods using methods other than frying.  · Limit canned vegetables. If you do use them, rinse them well to decrease the sodium.  · When eating at a restaurant, ask that your food be prepared with less salt, or no salt if possible.  WHAT FOODS CAN I EAT?  Seek help from a dietitian for individual calorie needs.  Grains  Whole grain or whole wheat bread. Brown rice. Whole grain or whole wheat pasta. Quinoa, bulgur, and whole grain cereals. Low-sodium cereals. Corn or whole wheat flour tortillas. Whole grain cornbread. Whole grain crackers. Low-sodium crackers.  Vegetables  Fresh or frozen vegetables  (raw, steamed, roasted, or grilled). Low-sodium or reduced-sodium tomato and vegetable juices. Low-sodium or reduced-sodium tomato sauce and paste. Low-sodium or reduced-sodium canned vegetables.   Fruits  All fresh, canned (in natural juice), or frozen fruits.  Meat and Other Protein Products  Ground beef (85% or leaner), grass-fed beef, or beef trimmed of fat. Skinless chicken or turkey. Ground chicken or turkey. Pork trimmed of fat. All fish and seafood. Eggs. Dried beans, peas, or lentils. Unsalted nuts and seeds. Unsalted canned beans.  Dairy  Low-fat dairy products, such as skim or 1% milk, 2% or reduced-fat cheeses, low-fat ricotta or cottage cheese, or plain low-fat yogurt. Low-sodium or reduced-sodium cheeses.  Fats and Oils  Tub margarines without trans fats. Light or reduced-fat mayonnaise and salad dressings (reduced sodium). Avocado. Safflower, olive, or canola oils. Natural peanut or almond butter.  Other  Unsalted popcorn and pretzels.  The items listed above may not be a complete list of recommended foods or beverages. Contact your dietitian for more options.  WHAT FOODS ARE NOT RECOMMENDED?  Grains  White bread. White pasta. White rice. Refined cornbread. Bagels and croissants. Crackers that contain trans fat.  Vegetables  Creamed or fried vegetables. Vegetables in a cheese sauce. Regular canned vegetables. Regular canned tomato sauce and paste. Regular tomato and vegetable juices.  Fruits  Dried fruits. Canned fruit in light or heavy syrup. Fruit juice.  Meat and Other Protein   Products  Fatty cuts of meat. Ribs, chicken wings, bacon, sausage, bologna, salami, chitterlings, fatback, hot dogs, bratwurst, and packaged luncheon meats. Salted nuts and seeds. Canned beans with salt.  Dairy  Whole or 2% milk, cream, half-and-half, and cream cheese. Whole-fat or sweetened yogurt. Full-fat cheeses or blue cheese. Nondairy creamers and whipped toppings. Processed cheese, cheese spreads, or cheese  curds.  Condiments  Onion and garlic salt, seasoned salt, table salt, and sea salt. Canned and packaged gravies. Worcestershire sauce. Tartar sauce. Barbecue sauce. Teriyaki sauce. Soy sauce, including reduced sodium. Steak sauce. Fish sauce. Oyster sauce. Cocktail sauce. Horseradish. Ketchup and mustard. Meat flavorings and tenderizers. Bouillon cubes. Hot sauce. Tabasco sauce. Marinades. Taco seasonings. Relishes.  Fats and Oils  Butter, stick margarine, lard, shortening, ghee, and bacon fat. Coconut, palm kernel, or palm oils. Regular salad dressings.  Other  Pickles and olives. Salted popcorn and pretzels.  The items listed above may not be a complete list of foods and beverages to avoid. Contact your dietitian for more information.  WHERE CAN I FIND MORE INFORMATION?  National Heart, Lung, and Blood Institute: www.nhlbi.nih.gov/health/health-topics/topics/dash/     This information is not intended to replace advice given to you by your health care provider. Make sure you discuss any questions you have with your health care provider.     Document Released: 12/25/2010 Document Revised: 01/26/2014 Document Reviewed: 11/09/2012  Elsevier Interactive Patient Education ©2016 Elsevier Inc.

## 2015-03-16 LAB — PSA: Prostate Specific Ag, Serum: 2.5 ng/mL (ref 0.0–4.0)

## 2015-03-16 LAB — BASIC METABOLIC PANEL
BUN/Creatinine Ratio: 17 (ref 9–20)
BUN: 18 mg/dL (ref 6–24)
CO2: 20 mmol/L (ref 18–29)
CREATININE: 1.09 mg/dL (ref 0.76–1.27)
Calcium: 9 mg/dL (ref 8.7–10.2)
Chloride: 97 mmol/L (ref 96–106)
GFR calc Af Amer: 87 mL/min/{1.73_m2} (ref >59)
GFR calc non Af Amer: 75 mL/min/{1.73_m2} (ref >59)
GLUCOSE: 95 mg/dL (ref 65–99)
Potassium: 4.4 mmol/L (ref 3.5–5.2)
SODIUM: 137 mmol/L (ref 134–144)

## 2015-03-16 LAB — LIPID PANEL
CHOLESTEROL TOTAL: 205 mg/dL — AB (ref 100–199)
Chol/HDL Ratio: 8.2 ratio units — ABNORMAL HIGH (ref 0.0–5.0)
HDL: 25 mg/dL — ABNORMAL LOW (ref >39)
LDL CALC: 112 mg/dL — AB (ref 0–99)
Triglycerides: 338 mg/dL — ABNORMAL HIGH (ref 0–149)
VLDL Cholesterol Cal: 68 mg/dL — ABNORMAL HIGH (ref 5–40)

## 2015-03-18 ENCOUNTER — Other Ambulatory Visit: Payer: Self-pay

## 2015-03-18 DIAGNOSIS — Z79899 Other long term (current) drug therapy: Secondary | ICD-10-CM

## 2015-03-18 DIAGNOSIS — E785 Hyperlipidemia, unspecified: Secondary | ICD-10-CM

## 2015-03-18 MED ORDER — ATORVASTATIN CALCIUM 10 MG PO TABS: 10.0000 mg | ORAL_TABLET | Freq: Every day | ORAL | Status: DC

## 2015-07-13 IMAGING — RF DG HIP COMPLETE 2+V*R*
1 series · 2 of 2 positions shown · non-contrast
Comparison: No priors.

CLINICAL DATA: Right total hip arthroplasty.

RIGHT HIP - COMPLETE 2+ VIEW

[Series 1: run · 2 of 2 slices shown]
[im 1/2]
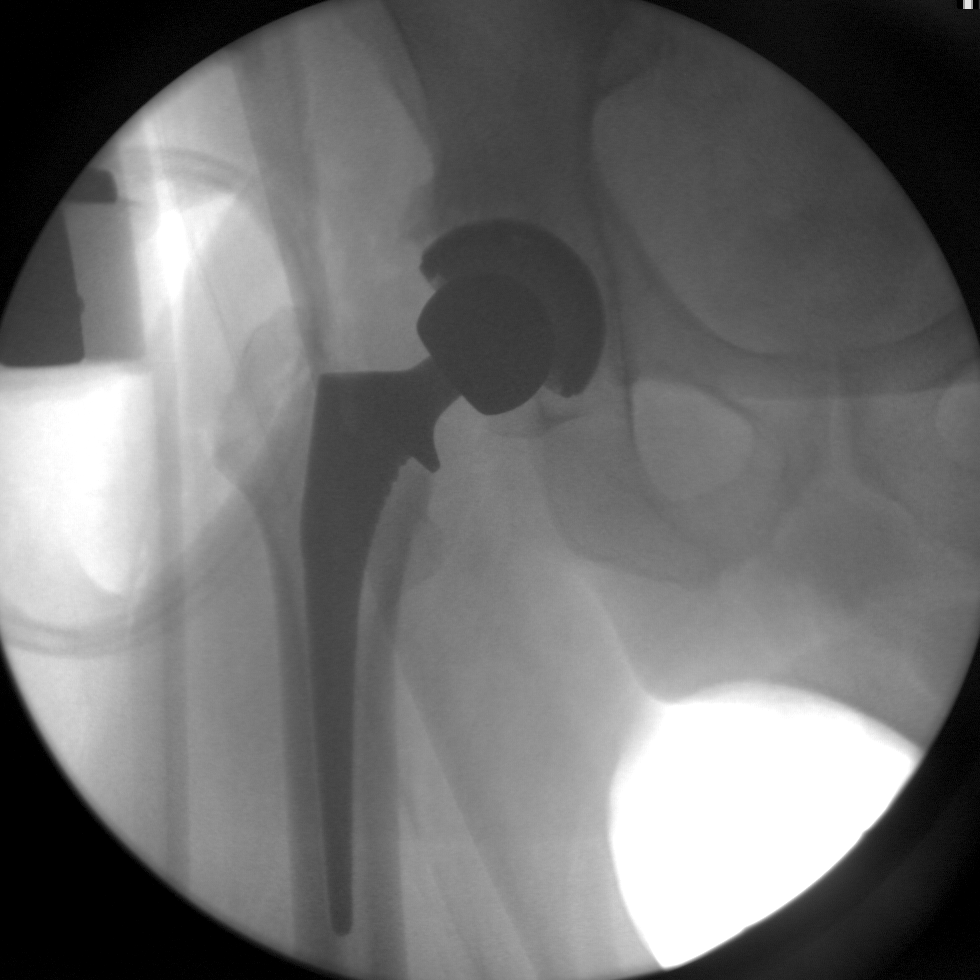
[im 2/2]
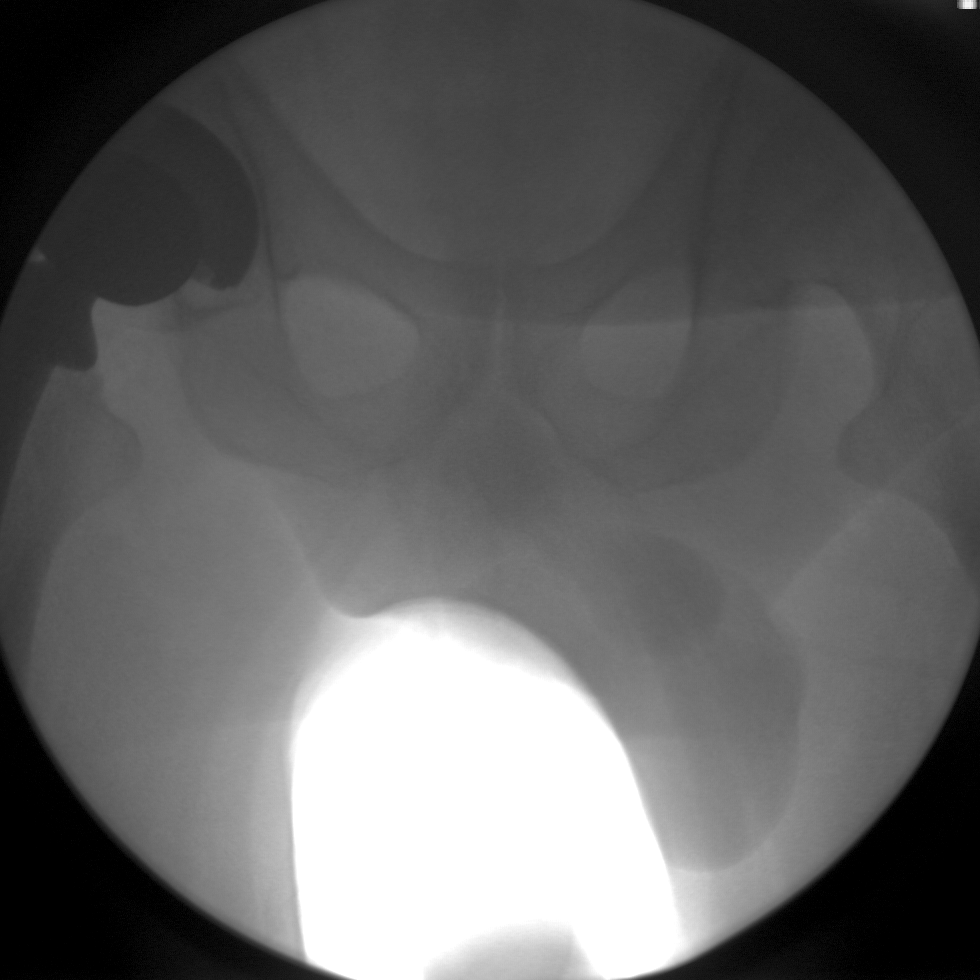

[2 of 2 positions shown; findings below may reference images not displayed]

FINDINGS: Two intraoperative spot films of the right hip
demonstrate placement of a right total hip prosthesis.  The femoral
and acetabular components of the prosthesis appear properly located
on these limited views.  No definite periprostatic fracture is
identified on this fluoroscopic spot film.  Prosthetic femoral head
projects within the prosthetic acetabulum on these two views.
IMPRESSION: 1.  Intraoperative documentation of right total hip arthroplasty,
as above, without definite acute complicating features.

## 2015-09-28 ENCOUNTER — Other Ambulatory Visit: Payer: Self-pay | Admitting: Family Medicine

## 2015-11-22 ENCOUNTER — Encounter: Payer: Self-pay | Admitting: Family Medicine

## 2015-11-22 ENCOUNTER — Ambulatory Visit (INDEPENDENT_AMBULATORY_CARE_PROVIDER_SITE_OTHER): Payer: 59 | Admitting: Family Medicine

## 2015-11-22 VITALS — BP 132/86 | Ht 66.0 in | Wt 248.2 lb

## 2015-11-22 DIAGNOSIS — E784 Other hyperlipidemia: Secondary | ICD-10-CM | POA: Diagnosis not present

## 2015-11-22 DIAGNOSIS — Z0000 Encounter for general adult medical examination without abnormal findings: Secondary | ICD-10-CM

## 2015-11-22 DIAGNOSIS — Z125 Encounter for screening for malignant neoplasm of prostate: Secondary | ICD-10-CM | POA: Diagnosis not present

## 2015-11-22 DIAGNOSIS — E7849 Other hyperlipidemia: Secondary | ICD-10-CM

## 2015-11-22 DIAGNOSIS — I10 Essential (primary) hypertension: Secondary | ICD-10-CM | POA: Diagnosis not present

## 2015-11-22 MED ORDER — LISINOPRIL-HYDROCHLOROTHIAZIDE 20-12.5 MG PO TABS: 1.0000 | ORAL_TABLET | Freq: Every day | ORAL | 3 refills | Status: DC

## 2015-11-22 NOTE — Patient Instructions (Signed)
Cholesterol Cholesterol is a white, waxy, fat-like substance needed by your body in small amounts. The liver makes all the cholesterol you need. Cholesterol is carried from the liver by the blood through the blood vessels. Deposits of cholesterol (plaque) may build up on blood vessel walls. These make the arteries narrower and stiffer. Cholesterol plaques increase the risk for heart attack and stroke.  You cannot feel your cholesterol level even if it is very high. The only way to know it is high is with a blood test. Once you know your cholesterol levels, you should keep a record of the test results. Work with your health care provider to keep your levels in the desired range.  WHAT DO THE RESULTS MEAN?  Total cholesterol is a rough measure of all the cholesterol in your blood.   LDL is the so-called bad cholesterol. This is the type that deposits cholesterol in the walls of the arteries. You want this level to be low.   HDL is the good cholesterol because it cleans the arteries and carries the LDL away. You want this level to be high.  Triglycerides are fat that the body can either burn for energy or store. High levels are closely linked to heart disease.  WHAT ARE THE DESIRED LEVELS OF CHOLESTEROL?  Total cholesterol below 200.   LDL below 100 for people at risk, below 70 for those at very high risk.   HDL above 50 is good, above 60 is best.   Triglycerides below 150.  HOW CAN I LOWER MY CHOLESTEROL?  Diet. Follow your diet programs as directed by your health care provider.   Choose fish or white meat chicken and [place], roasted or baked. Limit fatty cuts of red meat, fried foods, and processed meats, such as sausage and lunch meats.   Eat lots of fresh fruits and vegetables.  Choose whole grains, beans, pasta, potatoes, and cereals.   Use only small amounts of olive, corn, or canola oils.   Avoid butter, mayonnaise, shortening, or palm kernel oils.  Avoid foods with  trans fats.   Drink skim or nonfat milk and eat low-fat or nonfat yogurt and cheeses. Avoid whole milk, cream, ice cream, egg yolks, and full-fat cheeses.   Healthy desserts include angel food cake, ginger snaps, animal crackers, hard candy, popsicles, and low-fat or nonfat frozen yogurt. Avoid pastries, cakes, pies, and cookies.   Exercise. Follow your exercise programs as directed by your health care provider.   A regular program helps decrease LDL and raise HDL.   A regular program helps with weight control.   Do things that increase your activity level like gardening, walking, or taking the stairs. Ask your health care provider about how you can be more active in your daily life.   Medicine. Take medicine only as directed by your health care provider.   Medicine may be prescribed by your health care provider to help lower cholesterol and decrease the risk for heart disease.   If you have several risk factors, you may need medicine even if your levels are normal.   This information is not intended to replace advice given to you by your health care provider. Make sure you discuss any questions you have with your health care provider.   Document Released: 09/30/2000 Document Revised: 01/26/2014 Document Reviewed: 10/19/2012 Elsevier Interactive Patient Education 2016 [place] DASH stands for "Dietary Approaches to Stop Hypertension." The DASH eating plan is a healthy eating plan that has been  shown to reduce high blood pressure (hypertension). Additional health benefits may include reducing the risk of type 2 diabetes mellitus, heart disease, and stroke. The DASH eating plan may also help with weight loss. WHAT DO I NEED TO KNOW ABOUT THE DASH EATING PLAN? For the DASH eating plan, you will follow these general guidelines:  Choose foods with a percent daily value for sodium of less than 5% (as listed on the food label).  Use salt-free seasonings or  herbs instead of table salt or sea salt.  Check with your health care provider or pharmacist before using salt substitutes.  Eat lower-sodium products, often labeled as "lower sodium" or "no salt added."  Eat fresh foods.  Eat more vegetables, fruits, and low-fat dairy products.  Choose whole grains. Look for the word "whole" as the first word in the ingredient list.  Choose fish and skinless chicken or [place] more often than red meat. Limit fish, poultry, and meat to 6 oz (170 g) each day.  Limit sweets, desserts, sugars, and sugary drinks.  Choose heart-healthy fats.  Limit cheese to 1 oz (28 g) per day.  Eat more home-cooked food and less restaurant, buffet, and fast food.  Limit fried foods.  Cook foods using methods other than frying.  Limit canned vegetables. If you do use them, rinse them well to decrease the sodium.  When eating at a restaurant, ask that your food be prepared with less salt, or no salt if possible. WHAT FOODS CAN I EAT? Seek help from a dietitian for individual calorie needs. Grains Whole grain or whole wheat bread. Brown rice. Whole grain or whole wheat pasta. Quinoa, bulgur, and whole grain cereals. Low-sodium cereals. Corn or whole wheat flour tortillas. Whole grain cornbread. Whole grain crackers. Low-sodium crackers. Vegetables Fresh or frozen vegetables (raw, steamed, roasted, or grilled). Low-sodium or reduced-sodium tomato and vegetable juices. Low-sodium or reduced-sodium tomato sauce and paste. Low-sodium or reduced-sodium canned vegetables.  Fruits All fresh, canned (in natural juice), or frozen fruits. Meat and Other Protein Products Ground beef (85% or leaner), grass-fed beef, or beef trimmed of fat. Skinless chicken or [place]. Ground chicken or [place]. Pork trimmed of fat. All fish and seafood. Eggs. Dried beans, peas, or lentils. Unsalted nuts and seeds. Unsalted canned beans. Dairy Low-fat dairy products, such as skim or 1% milk, 2% or  reduced-fat cheeses, low-fat ricotta or cottage cheese, or plain low-fat yogurt. Low-sodium or reduced-sodium cheeses. Fats and Oils Tub margarines without trans fats. Light or reduced-fat mayonnaise and salad dressings (reduced sodium). Avocado. Safflower, olive, or canola oils. Natural peanut or almond butter. Other Unsalted popcorn and pretzels. The items listed above may not be a complete list of recommended foods or beverages. Contact your dietitian for more options. WHAT FOODS ARE NOT RECOMMENDED? Grains White bread. White pasta. White rice. Refined cornbread. Bagels and croissants. Crackers that contain trans fat. Vegetables Creamed or fried vegetables. Vegetables in a cheese sauce. Regular canned vegetables. Regular canned tomato sauce and paste. Regular tomato and vegetable juices. Fruits Dried fruits. Canned fruit in light or heavy syrup. Fruit juice. Meat and Other Protein Products Fatty cuts of meat. Ribs, chicken wings, bacon, sausage, bologna, salami, chitterlings, fatback, hot dogs, bratwurst, and packaged luncheon meats. Salted nuts and seeds. Canned beans with salt. Dairy Whole or 2% milk, cream, half-and-half, and cream cheese. Whole-fat or sweetened yogurt. Full-fat cheeses or blue cheese. Nondairy creamers and whipped toppings. Processed cheese, cheese spreads, or cheese curds. Condiments Onion and garlic salt, seasoned  salt, table salt, and sea salt. Canned and packaged gravies. Worcestershire sauce. Tartar sauce. Barbecue sauce. Teriyaki sauce. Soy sauce, including reduced sodium. Steak sauce. Fish sauce. Oyster sauce. Cocktail sauce. Horseradish. Ketchup and mustard. Meat flavorings and tenderizers. Bouillon cubes. Hot sauce. Tabasco sauce. Marinades. Taco seasonings. Relishes. Fats and Oils Butter, stick margarine, lard, shortening, ghee, and bacon fat. Coconut, palm kernel, or palm oils. Regular salad dressings. Other Pickles and olives. Salted popcorn and  pretzels. The items listed above may not be a complete list of foods and beverages to avoid. Contact your dietitian for more information. WHERE CAN I FIND MORE INFORMATION? National Heart, Lung, and Blood Institute: travelstabloid.com   This information is not intended to replace advice given to you by your health care provider. Make sure you discuss any questions you have with your health care provider.   Document Released: 12/25/2010 Document Revised: 01/26/2014 Document Reviewed: 11/09/2012 Elsevier Interactive Patient Education [employer].

## 2015-11-22 NOTE — Progress Notes (Signed)
Subjective:    Patient ID: Brian Salazar, male    DOB: May 14, 1955, 61 y.o.   MRN: [id number]  HPI  The patient comes in today for a wellness visit.    A review of their health history was completed.  A review of medications was also completed.  Any needed refills; yes  Eating habits: trying too now  Falls/  MVA accidents in past few months: none  Regular exercise: not really  Specialist pt sees on regular basis: no  Preventative health issues were discussed.   Additional concerns: none   Review of Systems  Constitutional: Negative for activity change, appetite change and fever.  HENT: Negative for congestion and rhinorrhea.   Eyes: Negative for discharge.  Respiratory: Negative for cough and wheezing.   Cardiovascular: Negative for chest pain.  Gastrointestinal: Negative for abdominal pain, blood in stool and vomiting.  Genitourinary: Negative for difficulty urinating and frequency.  Musculoskeletal: Negative for neck pain.  Skin: Negative for rash.  Allergic/Immunologic: Negative for environmental allergies and food allergies.  Neurological: Negative for weakness and headaches.  Psychiatric/Behavioral: Negative for agitation.       Objective:   Physical Exam  Constitutional: He appears well-developed and well-nourished.  HENT:  Head: Normocephalic and atraumatic.  Right Ear: External ear normal.  Left Ear: External ear normal.  Nose: Nose normal.  Mouth/Throat: Oropharynx is clear and moist.  Eyes: EOM are normal. Pupils are equal, round, and reactive to light.  Neck: Normal range of motion. Neck supple. No thyromegaly present.  Cardiovascular: Normal rate, regular rhythm and normal heart sounds.   No murmur heard. Pulmonary/Chest: Effort normal and breath sounds normal. No respiratory distress. He has no wheezes.  Abdominal: Soft. Bowel sounds are normal. He exhibits no distension and no mass. There is no tenderness.  Genitourinary: Penis normal.    Musculoskeletal: Normal range of motion. He exhibits no edema.  Lymphadenopathy:    He has no cervical adenopathy.  Neurological: He is alert. He exhibits normal muscle tone.  Skin: Skin is warm and dry. No erythema.  Psychiatric: He has a normal mood and affect. His behavior is normal. Judgment normal.   Patient does take his blood pressure medicine on a regular basis tries watch his diet he is trying to lose weight he's lost about 20 pounds in the past 6 months previously labs reviewed with patient. Patient does take his blood pressure medicine on a regular basis        Assessment & Plan:  Adult wellness-complete.wellness physical was conducted today. Importance of diet and exercise were discussed in detail. In addition to this a discussion regarding safety was also covered. We also reviewed over immunizations and gave recommendations regarding current immunization needed for age. In addition to this additional areas were also touched on including: Preventative health exams needed: Colonoscopy UTD  Patient was advised yearly wellness exam The patient was seen today as part of an evaluation regarding hyperlipidemia. Recent lab work has been reviewed with the patient as well as the goals for good cholesterol care. In addition to this medications have been discussed the importance of compliance with diet and medications discussed as well. Patient has been informed of potential side effects of medications in the importance to notify us should any problems occur. Finally the patient is aware that poor control of cholesterol, noncompliance can dramatically increase her risk of heart attack strokes and premature death. The patient will keep regular office visits and the patient does agreed to periodic  lab work.  HTN- Patient was seen today as part of a visit regarding hypertension. The importance of healthy diet and regular physical activity was discussed. The importance of compliance with  medications discussed. Ideal goal is to keep blood pressure low elevated levels certainly below [id number] when possible. The patient was counseled that keeping blood pressure under control lessen his risk of heart attack, stroke, kidney failure, and early death. The importance of regular follow-ups was discussed with the patient. Low-salt diet such as DASH recommended. Regular physical activity was recommended as well. Patient was advised to keep regular follow-ups. Recheck 12 months patient will do lab work await the results previous labs reviewed PSA in February

## 2015-11-23 LAB — BASIC METABOLIC PANEL
BUN/Creatinine Ratio: 19 (ref 9–20)
BUN: 20 mg/dL (ref 6–24)
CALCIUM: 9.1 mg/dL (ref 8.7–10.2)
CHLORIDE: 98 mmol/L (ref 96–106)
CO2: 22 mmol/L (ref 18–29)
Creatinine, Ser: 1.08 mg/dL (ref 0.76–1.27)
GFR calc Af Amer: 87 mL/min/{1.73_m2} (ref >59)
GFR calc non Af Amer: 75 mL/min/{1.73_m2} (ref >59)
Glucose: 98 mg/dL (ref 65–99)
Potassium: 4.8 mmol/L (ref 3.5–5.2)
Sodium: 138 mmol/L (ref 134–144)

## 2015-11-23 LAB — LIPID PANEL
CHOL/HDL RATIO: 6.9 ratio — AB (ref 0.0–5.0)
Cholesterol, Total: 180 mg/dL (ref 100–199)
HDL: 26 mg/dL — AB (ref >39)
LDL CALC: 130 mg/dL — AB (ref 0–99)
TRIGLYCERIDES: 118 mg/dL (ref 0–149)
VLDL Cholesterol Cal: 24 mg/dL (ref 5–40)

## 2015-11-23 LAB — HEPATIC FUNCTION PANEL
ALT: 48 IU/L — AB (ref 0–44)
AST: 31 IU/L (ref 0–40)
Albumin: 4.3 g/dL (ref 3.5–5.5)
Alkaline Phosphatase: 92 IU/L (ref 39–117)
Bilirubin Total: 0.2 mg/dL (ref 0.0–1.2)
Bilirubin, Direct: 0.08 mg/dL (ref 0.00–0.40)
Total Protein: 7.1 g/dL (ref 6.0–8.5)

## 2015-12-11 MED ORDER — ATORVASTATIN CALCIUM 10 MG PO TABS: 10.0000 mg | ORAL_TABLET | Freq: Every day | ORAL | 5 refills | Status: DC

## 2015-12-11 NOTE — Addendum Note (Signed)
Addended by: Launa Grill on: 12/11/2015 09:59 AM   Modules accepted: Orders

## 2016-04-03 DIAGNOSIS — D485 Neoplasm of uncertain behavior of skin: Secondary | ICD-10-CM | POA: Diagnosis not present

## 2016-04-03 DIAGNOSIS — L738 Other specified follicular disorders: Secondary | ICD-10-CM | POA: Diagnosis not present

## 2016-04-03 DIAGNOSIS — C44319 Basal cell carcinoma of skin of other parts of face: Secondary | ICD-10-CM | POA: Diagnosis not present

## 2016-04-13 ENCOUNTER — Other Ambulatory Visit: Payer: Self-pay | Admitting: *Deleted

## 2016-04-13 ENCOUNTER — Telehealth: Payer: Self-pay | Admitting: Family Medicine

## 2016-04-13 MED ORDER — AZITHROMYCIN 250 MG PO TABS: ORAL_TABLET | ORAL | 0 refills | Status: DC

## 2016-04-13 NOTE — Telephone Encounter (Signed)
Patients granddaughter saw Dr. Richardson Landry this morning [facility]) and was diagnosed with strep throat.  Bobbye is having a sore throat and wants to know if Dr. Richardson Landry can call something in.  Hope lives with him and his wife.  [facility] [place]

## 2016-04-13 NOTE — Telephone Encounter (Signed)
zpk

## 2016-04-13 NOTE — Telephone Encounter (Signed)
Med sent to pharm. Pt notified.  

## 2016-06-04 DIAGNOSIS — C44319 Basal cell carcinoma of skin of other parts of face: Secondary | ICD-10-CM | POA: Diagnosis not present

## 2016-06-12 DIAGNOSIS — L918 Other hypertrophic disorders of the skin: Secondary | ICD-10-CM | POA: Diagnosis not present

## 2016-06-12 DIAGNOSIS — B353 Tinea pedis: Secondary | ICD-10-CM | POA: Diagnosis not present

## 2016-06-12 DIAGNOSIS — D2262 Melanocytic nevi of left upper limb, including shoulder: Secondary | ICD-10-CM | POA: Diagnosis not present

## 2016-11-23 ENCOUNTER — Ambulatory Visit: Payer: 59 | Admitting: Nurse Practitioner

## 2016-11-23 ENCOUNTER — Encounter: Payer: Self-pay | Admitting: Nurse Practitioner

## 2016-11-23 VITALS — BP 122/82 | Temp 98.2°F | Ht 66.0 in | Wt 248.0 lb

## 2016-11-23 DIAGNOSIS — H7291 Unspecified perforation of tympanic membrane, right ear: Secondary | ICD-10-CM

## 2016-11-24 ENCOUNTER — Encounter: Payer: Self-pay | Admitting: Nurse Practitioner

## 2016-11-24 NOTE — Progress Notes (Signed)
Subjective:  Presents for c/o possible puncture of the right ear drum after inserting a Qtip. Slight blood noted immediately after but none since. No drainage. No fever or headache. Slight decreased hearing.   Objective:   BP 122/82   Temp 98.2 F (36.8 C) (Oral)   Ht 5\' 6"  (1.676 m)   Wt 248 lb (112.5 kg)   BMI 40.03 kg/m  NAD. Small amount of dark blood noted in the mid TM. No drainage. Unable to see a perforation due to blood. No erythema.   Assessment:  Perforation of right tympanic membrane    Plan:  Expect gradual resolution. Call back if any problems.

## 2017-01-18 ENCOUNTER — Ambulatory Visit: Payer: 59 | Admitting: Family Medicine

## 2017-01-18 ENCOUNTER — Encounter: Payer: Self-pay | Admitting: Family Medicine

## 2017-01-18 VITALS — BP 132/82 | Ht 66.0 in | Wt 272.2 lb

## 2017-01-18 DIAGNOSIS — H6981 Other specified disorders of Eustachian tube, right ear: Secondary | ICD-10-CM | POA: Diagnosis not present

## 2017-01-18 DIAGNOSIS — E7849 Other hyperlipidemia: Secondary | ICD-10-CM

## 2017-01-18 DIAGNOSIS — Z1159 Encounter for screening for other viral diseases: Secondary | ICD-10-CM

## 2017-01-18 DIAGNOSIS — I10 Essential (primary) hypertension: Secondary | ICD-10-CM

## 2017-01-18 DIAGNOSIS — Z125 Encounter for screening for malignant neoplasm of prostate: Secondary | ICD-10-CM | POA: Diagnosis not present

## 2017-01-18 MED ORDER — LISINOPRIL-HYDROCHLOROTHIAZIDE 20-12.5 MG PO TABS: 1.0000 | ORAL_TABLET | Freq: Every day | ORAL | 1 refills | Status: DC

## 2017-01-18 NOTE — Progress Notes (Signed)
   Subjective:    Patient ID: Brian Salazar, male    DOB: 09/15/1955, 61 y.o.   MRN: [id number]  HPI  Patient arrives to recheck ear pain- was seen for possible ruptured right ear drum last month. Patient relates it relates right ear fullness relates slight decreased hearing denies any severe headache sinus pressure pain wheezing difficulty breathing nausea vomiting diarrhea  Has blood pressure once refills on this has not been seen in a year Review of Systems Denies headaches chest pain shortness of breath fever chills sweats    Objective:   Physical Exam HEENT eardrums are normal no tympanic rupture noted neck no masses lungs clear heart regular blood pressure reasonable       Assessment & Plan:  HTN refills given he needs follow-up for comprehensive health visit lab work ordered await results  Right ear eustachian tube dysfunction Flonase as directed if ongoing troubles recommend follow-up certainly call us if not getting better we will set him up with ENT.

## 2017-02-12 DIAGNOSIS — M79671 Pain in right foot: Secondary | ICD-10-CM | POA: Diagnosis not present

## 2017-05-01 DIAGNOSIS — I10 Essential (primary) hypertension: Secondary | ICD-10-CM | POA: Diagnosis not present

## 2017-05-02 LAB — HEPATITIS C ANTIBODY

## 2017-05-02 LAB — LIPID PANEL
Chol/HDL Ratio: 6.8 ratio — ABNORMAL HIGH (ref 0.0–5.0)
Cholesterol, Total: 196 mg/dL (ref 100–199)
HDL: 29 mg/dL — ABNORMAL LOW (ref >39)
LDL CALC: 114 mg/dL — AB (ref 0–99)
TRIGLYCERIDES: 263 mg/dL — AB (ref 0–149)
VLDL CHOLESTEROL CAL: 53 mg/dL — AB (ref 5–40)

## 2017-05-02 LAB — HEPATIC FUNCTION PANEL
ALBUMIN: 3.8 g/dL (ref 3.6–4.8)
ALK PHOS: 92 IU/L (ref 39–117)
ALT: 27 IU/L (ref 0–44)
AST: 20 IU/L (ref 0–40)
Bilirubin, Direct: 0.06 mg/dL (ref 0.00–0.40)
Total Protein: 6.4 g/dL (ref 6.0–8.5)

## 2017-05-02 LAB — PSA: Prostate Specific Ag, Serum: 3.1 ng/mL (ref 0.0–4.0)

## 2017-06-11 ENCOUNTER — Encounter: Payer: Self-pay | Admitting: Family Medicine

## 2017-06-11 ENCOUNTER — Ambulatory Visit (INDEPENDENT_AMBULATORY_CARE_PROVIDER_SITE_OTHER): Payer: 59 | Admitting: Family Medicine

## 2017-06-11 VITALS — BP 156/90 | Ht 66.0 in | Wt 265.0 lb

## 2017-06-11 DIAGNOSIS — E7849 Other hyperlipidemia: Secondary | ICD-10-CM | POA: Diagnosis not present

## 2017-06-11 DIAGNOSIS — Z0000 Encounter for general adult medical examination without abnormal findings: Secondary | ICD-10-CM

## 2017-06-11 DIAGNOSIS — I10 Essential (primary) hypertension: Secondary | ICD-10-CM

## 2017-06-11 MED ORDER — LISINOPRIL-HYDROCHLOROTHIAZIDE 20-12.5 MG PO TABS: 1.0000 | ORAL_TABLET | Freq: Every day | ORAL | 1 refills | Status: DC

## 2017-06-11 MED ORDER — EZETIMIBE 10 MG PO TABS: 10.0000 mg | ORAL_TABLET | Freq: Every day | ORAL | 3 refills | Status: DC

## 2017-06-11 NOTE — Progress Notes (Signed)
   Subjective:    Patient ID: Brian Salazar, male    DOB: Jun 27, 1955, 62 y.o.   MRN: [id number]  HPI The patient comes in today for a wellness visit.    A review of their health history was completed.  A review of medications was also completed.  Any needed refills; lisinopril/hctz  Eating habits: not health conscious  Falls/  MVA accidents in past few months: none  Regular exercise: none  Specialist pt sees on regular basis: none  Preventative health issues were discussed.   Additional concerns: none Patient understands he is overweight Patient states that currently he has grandchildren and children in his household and his diet is not as good as it could be Currently his work keeps him from exercising as much as he would like He is hopeful over the course of the next year he can lose weight He does take his blood pressure medicine on a regular basis He has had a recent lab work which showed significant elevation in cholesterol but patient did not tolerate statins last time he tried   Review of Systems  Constitutional: Negative for activity change, appetite change and fever.  HENT: Negative for congestion and rhinorrhea.   Eyes: Negative for discharge.  Respiratory: Negative for cough and wheezing.   Cardiovascular: Negative for chest pain.  Gastrointestinal: Negative for abdominal pain, blood in stool and vomiting.  Genitourinary: Negative for difficulty urinating and frequency.  Musculoskeletal: Negative for neck pain.  Skin: Negative for rash.  Allergic/Immunologic: Negative for environmental allergies and food allergies.  Neurological: Negative for weakness and headaches.  Psychiatric/Behavioral: Negative for agitation.       Objective:   Physical Exam  Constitutional: He appears well-developed and well-nourished.  HENT:  Head: Normocephalic and atraumatic.  Right Ear: External ear normal.  Left Ear: External ear normal.  Nose: Nose normal.  Mouth/Throat:  Oropharynx is clear and moist.  Eyes: Pupils are equal, round, and reactive to light. EOM are normal.  Neck: Normal range of motion. Neck supple. No thyromegaly present.  Cardiovascular: Normal rate, regular rhythm and normal heart sounds.  No murmur heard. Pulmonary/Chest: Effort normal and breath sounds normal. No respiratory distress. He has no wheezes.  Abdominal: Soft. Bowel sounds are normal. He exhibits no distension and no mass. There is no tenderness.  Genitourinary: Penis normal.  Musculoskeletal: Normal range of motion. He exhibits no edema.  Lymphadenopathy:    He has no cervical adenopathy.  Neurological: He is alert. He exhibits normal muscle tone.  Skin: Skin is warm and dry. No erythema.  Psychiatric: He has a normal mood and affect. His behavior is normal. Judgment normal.          Assessment & Plan:  Adult wellness-complete.wellness physical was conducted today. Importance of diet and exercise were discussed in detail.  In addition to this a discussion regarding safety was also covered. We also reviewed over immunizations and gave recommendations regarding current immunization needed for age.  In addition to this additional areas were also touched on including: Preventative health exams needed: Colonoscopy up-to-date on colonoscopy next 01/2023  Patient was advised yearly wellness exam Patient will be losing his job at the end of the year Cannot afford additional co-pays today Blood pressure and cholesterol management was completed as a courtesy today Blood pressure medicine continue ongoing good control  Add zetia 10 mg daily to help with cholesterol does not tolerate statins  Lab work toward the end of this year

## 2017-06-11 NOTE — Patient Instructions (Signed)
DASH Eating Plan DASH stands for "Dietary Approaches to Stop Hypertension." The DASH eating plan is a healthy eating plan that has been shown to reduce high blood pressure (hypertension). It may also reduce your risk for type 2 diabetes, heart disease, and stroke. The DASH eating plan may also help with weight loss. What are tips for following this plan? General guidelines  Avoid eating more than 2,300 mg (milligrams) of salt (sodium) a day. If you have hypertension, you may need to reduce your sodium intake to 1,500 mg a day.  Limit alcohol intake to no more than 1 drink a day for nonpregnant women and 2 drinks a day for men. One drink equals 12 oz of beer, 5 oz of wine, or 1 oz of hard liquor.  Work with your health care provider to maintain a healthy body weight or to lose weight. Ask what an ideal weight is for you.  Get at least 30 minutes of exercise that causes your heart to beat faster (aerobic exercise) most days of the week. Activities may include walking, swimming, or biking.  Work with your health care provider or diet and nutrition specialist (dietitian) to adjust your eating plan to your individual calorie needs. Reading food labels  Check food labels for the amount of sodium per serving. Choose foods with less than 5 percent of the Daily Value of sodium. Generally, foods with less than 300 mg of sodium per serving fit into this eating plan.  To find whole grains, look for the word "whole" as the first word in the ingredient list. Shopping  Buy products labeled as "low-sodium" or "no salt added."  Buy fresh foods. Avoid canned foods and premade or frozen meals. Cooking  Avoid adding salt when cooking. Use salt-free seasonings or herbs instead of table salt or sea salt. Check with your health care provider or pharmacist before using salt substitutes.  Do not fry foods. Cook foods using healthy methods such as baking, boiling, grilling, and broiling instead.  Cook with  heart-healthy oils, such as olive, canola, soybean, or sunflower oil. Meal planning   Eat a balanced diet that includes: ? 5 or more servings of fruits and vegetables each day. At each meal, try to fill half of your plate with fruits and vegetables. ? Up to 6-8 servings of whole grains each day. ? Less than 6 oz of lean meat, poultry, or fish each day. A 3-oz serving of meat is about the same size as a deck of cards. One egg equals 1 oz. ? 2 servings of low-fat dairy each day. ? A serving of nuts, seeds, or beans 5 times each week. ? Heart-healthy fats. Healthy fats called Omega-3 fatty acids are found in foods such as flaxseeds and coldwater fish, like sardines, salmon, and mackerel.  Limit how much you eat of the following: ? Canned or prepackaged foods. ? Food that is high in trans fat, such as fried foods. ? Food that is high in saturated fat, such as fatty meat. ? Sweets, desserts, sugary drinks, and other foods with added sugar. ? Full-fat dairy products.  Do not salt foods before eating.  Try to eat at least 2 vegetarian meals each week.  Eat more home-cooked food and less restaurant, buffet, and fast food.  When eating at a restaurant, ask that your food be prepared with less salt or no salt, if possible. What foods are recommended? The items listed may not be a complete list. Talk with your dietitian about what   dietary choices are best for you. Grains Whole-grain or whole-wheat bread. Whole-grain or whole-wheat pasta. Brown rice. Oatmeal. Quinoa. Bulgur. Whole-grain and low-sodium cereals. Pita bread. Low-fat, low-sodium crackers. Whole-wheat flour tortillas. Vegetables Fresh or frozen vegetables (raw, steamed, roasted, or grilled). Low-sodium or reduced-sodium tomato and vegetable juice. Low-sodium or reduced-sodium tomato sauce and tomato paste. Low-sodium or reduced-sodium canned vegetables. Fruits All fresh, dried, or frozen fruit. Canned fruit in natural juice (without  added sugar). Meat and other protein foods Skinless chicken or turkey. Ground chicken or turkey. Pork with fat trimmed off. Fish and seafood. Egg whites. Dried beans, peas, or lentils. Unsalted nuts, nut butters, and seeds. Unsalted canned beans. Lean cuts of beef with fat trimmed off. Low-sodium, lean deli meat. Dairy Low-fat (1%) or fat-free (skim) milk. Fat-free, low-fat, or reduced-fat cheeses. Nonfat, low-sodium ricotta or cottage cheese. Low-fat or nonfat yogurt. Low-fat, low-sodium cheese. Fats and oils Soft margarine without trans fats. Vegetable oil. Low-fat, reduced-fat, or light mayonnaise and salad dressings (reduced-sodium). Canola, safflower, olive, soybean, and sunflower oils. Avocado. Seasoning and other foods Herbs. Spices. Seasoning mixes without salt. Unsalted popcorn and pretzels. Fat-free sweets. What foods are not recommended? The items listed may not be a complete list. Talk with your dietitian about what dietary choices are best for you. Grains Baked goods made with fat, such as croissants, muffins, or some breads. Dry pasta or rice meal packs. Vegetables Creamed or fried vegetables. Vegetables in a cheese sauce. Regular canned vegetables (not low-sodium or reduced-sodium). Regular canned tomato sauce and paste (not low-sodium or reduced-sodium). Regular tomato and vegetable juice (not low-sodium or reduced-sodium). Pickles. Olives. Fruits Canned fruit in a light or heavy syrup. Fried fruit. Fruit in cream or butter sauce. Meat and other protein foods Fatty cuts of meat. Ribs. Fried meat. Bacon. Sausage. Bologna and other processed lunch meats. Salami. Fatback. Hotdogs. Bratwurst. Salted nuts and seeds. Canned beans with added salt. Canned or smoked fish. Whole eggs or egg yolks. Chicken or turkey with skin. Dairy Whole or 2% milk, cream, and half-and-half. Whole or full-fat cream cheese. Whole-fat or sweetened yogurt. Full-fat cheese. Nondairy creamers. Whipped toppings.  Processed cheese and cheese spreads. Fats and oils Butter. Stick margarine. Lard. Shortening. Ghee. Bacon fat. Tropical oils, such as coconut, palm kernel, or palm oil. Seasoning and other foods Salted popcorn and pretzels. Onion salt, garlic salt, seasoned salt, table salt, and sea salt. Worcestershire sauce. Tartar sauce. Barbecue sauce. Teriyaki sauce. Soy sauce, including reduced-sodium. Steak sauce. Canned and packaged gravies. Fish sauce. Oyster sauce. Cocktail sauce. Horseradish that you find on the shelf. Ketchup. Mustard. Meat flavorings and tenderizers. Bouillon cubes. Hot sauce and Tabasco sauce. Premade or packaged marinades. Premade or packaged taco seasonings. Relishes. Regular salad dressings. Where to find more information:  National Heart, Lung, and Blood Institute: www.nhlbi.nih.gov  American Heart Association: www.heart.org Summary  The DASH eating plan is a healthy eating plan that has been shown to reduce high blood pressure (hypertension). It may also reduce your risk for type 2 diabetes, heart disease, and stroke.  With the DASH eating plan, you should limit salt (sodium) intake to 2,300 mg a day. If you have hypertension, you may need to reduce your sodium intake to 1,500 mg a day.  When on the DASH eating plan, aim to eat more fresh fruits and vegetables, whole grains, lean proteins, low-fat dairy, and heart-healthy fats.  Work with your health care provider or diet and nutrition specialist (dietitian) to adjust your eating plan to your individual   calorie needs. This information is not intended to replace advice given to you by your health care provider. Make sure you discuss any questions you have with your health care provider. Document Released: 12/25/2010 Document Revised: 12/30/2015 Document Reviewed: 12/30/2015 Elsevier Interactive Patient Education  2018 Elsevier Inc.  

## 2017-07-20 ENCOUNTER — Encounter (INDEPENDENT_AMBULATORY_CARE_PROVIDER_SITE_OTHER): Payer: Self-pay | Admitting: Orthopaedic Surgery

## 2017-07-20 ENCOUNTER — Ambulatory Visit (INDEPENDENT_AMBULATORY_CARE_PROVIDER_SITE_OTHER): Payer: 59 | Admitting: Orthopaedic Surgery

## 2017-07-20 ENCOUNTER — Ambulatory Visit (INDEPENDENT_AMBULATORY_CARE_PROVIDER_SITE_OTHER): Payer: 59

## 2017-07-20 DIAGNOSIS — M25551 Pain in right hip: Secondary | ICD-10-CM

## 2017-07-20 DIAGNOSIS — Z96641 Presence of right artificial hip joint: Secondary | ICD-10-CM

## 2017-07-20 MED ORDER — NABUMETONE 500 MG PO TABS: 500.0000 mg | ORAL_TABLET | Freq: Two times a day (BID) | ORAL | 1 refills | Status: DC | PRN

## 2017-07-20 MED ORDER — METHYLPREDNISOLONE 4 MG PO TABS: ORAL_TABLET | ORAL | 0 refills | Status: DC

## 2017-07-20 NOTE — Progress Notes (Signed)
Office Visit Note   Patient: Brian Salazar           Date of Birth: 1955-10-19           MRN: [id number] Visit Date: 07/20/2017              Requested by: Kathyrn Drown, MD [address] [place], [place] [address] PCP: Kathyrn Drown, MD   Assessment & Plan: Visit Diagnoses:  1. Pain in right hip   2. History of total right hip replacement     Plan: The plain films of his right total hip arthroplasty show no complicating features.  I would like to at least try a 6-day steroid taper and anti-inflammatory following this and reevaluate him in 3 weeks to see how he is feeling overall.  His biggest problems with pain are when he sneezes real hard and this causes pain in the groin area.  We will see what he looks like in 3 weeks and determine whether or not we will obtain a bone scan.  Of note this does not seem to be consistent with a hernia in terms of his physical exam.  Follow-Up Instructions: Return in about 3 weeks (around 08/10/2017).   Orders:  Orders Placed This Encounter  Procedures  . XR HIP UNILAT W OR W/O PELVIS 1V RIGHT   Meds ordered this encounter  Medications  . methylPREDNISolone (MEDROL) 4 MG tablet    Sig: Medrol dose pack. Take as instructed    Dispense:  21 tablet    Refill:  0  . nabumetone (RELAFEN) 500 MG tablet    Sig: Take 1 tablet (500 mg total) by mouth 2 (two) times daily as needed.    Dispense:  60 tablet    Refill:  1      Procedures: No procedures performed   Clinical Data: No additional findings.   Subjective: Chief Complaint  Patient presents with  . Right Hip - Pain  Patient comes in with chief complaint of right hip pain.  We actually performed a right total hip arthroplasty through direct anterior approach in 2014.  He is been doing well for a while but over the last 6 months has developed pain in his hip in the groin areas where he points to.  He says sometimes it hurts when he sneezes sometimes he almost gets a  sensation like it will pop out of place.  He has never had a dislocation and is done well postoperatively up until the last 6 months.  He does not hurt on the lateral aspect of his hips and just in the groin area.  He has not had any recent illnesses.  HPI  Review of Systems He currently denies any headache, chest pain, shortness of breath, fever, chills, nausea, vomiting.  He is alert and oriented x3 and in no acute distress  Objective: Vital Signs: There were no vitals taken for this visit.  Physical Exam He is alert and oriented x3  Ortho Exam Examination of his right hip shows pain with internal and external rotation of the seems to be at the groin crease.  There is no lateral pain.  There is no leg length discrepancy. Specialty Comments:  No specialty comments available.  Imaging: Xr Hip Unilat W Or W/o Pelvis 1v Right  Result Date: 07/20/2017 An AP pelvis and a lateral of the right hip shows a total hip arthroplasty on the right hip with no complicating features no evidence of loosening.  The left hip on the AP view has significant osteoarthritis with joint space narrowing and periarticular osteophytes as well as sclerotic changes.    PMFS History: Patient Active Problem List   Diagnosis Date Noted  . History of total right hip replacement 07/20/2017  . Morbid obesity ([place]) 02/15/2015  . Umbilical hernia without obstruction and without gangrene 06/22/2014  . Degenerative arthritis of hip, right 09/16/2012  . Essential hypertension, benign 08/30/2012  . Hyperlipidemia 08/30/2012  . Arthralgia of hip 08/30/2012   Past Medical History:  Diagnosis Date  . Arthritis    OA RIGHT HIP  . GERD (gastroesophageal reflux disease)    occas  . Hypertension   . Shortness of breath    with exertion    History reviewed. No pertinent family history.  Past Surgical History:  Procedure Laterality Date  . BIOPSY  07/28/2013   Procedure: BIOPSY;  Surgeon: Danie Binder, MD;  Location:  [facility];  Service: Endoscopy;;  . COLONOSCOPY    . COLONOSCOPY N/A 07/28/2013   Procedure: COLONOSCOPY;  Surgeon: Danie Binder, MD;  Location: [facility];  Service: Endoscopy;  Laterality: N/A;  10:45 AM  . POLYPECTOMY  07/28/2013   Procedure: POLYPECTOMY;  Surgeon: Danie Binder, MD;  Location: [facility];  Service: Endoscopy;;  . TOTAL HIP ARTHROPLASTY Right 09/16/2012   Procedure: RIGHT TOTAL HIP ARTHROPLASTY ANTERIOR APPROACH;  Surgeon: Mcarthur Rossetti, MD;  Location: [facility];  Service: Orthopedics;  Laterality: Right;  Right Total Hip Arthroplasty, Anterior Approach   Social History   Occupational History  . Not on file  Tobacco Use  . Smoking status: Never Smoker  . Smokeless tobacco: Never Used  Substance and Sexual Activity  . Alcohol use: Yes    Comment: RARE ALCOHOL  . Drug use: No  . Sexual activity: Not on file

## 2017-08-11 ENCOUNTER — Encounter (INDEPENDENT_AMBULATORY_CARE_PROVIDER_SITE_OTHER): Payer: Self-pay | Admitting: Orthopaedic Surgery

## 2017-08-11 ENCOUNTER — Ambulatory Visit (INDEPENDENT_AMBULATORY_CARE_PROVIDER_SITE_OTHER): Payer: 59 | Admitting: Orthopaedic Surgery

## 2017-08-11 DIAGNOSIS — M25551 Pain in right hip: Secondary | ICD-10-CM

## 2017-08-11 NOTE — Progress Notes (Signed)
HPI: Mr. Danner returns today follow-up of his right hip.  He feels the Medrol Dosepak really did not help but his pain did dissipate once he started taking the Relafen.  He is taking the Relafen once daily has had no GI upset.  He states that the pain in the hip he was having when there is sneezing over the last few days is not been present is been very minimal.  Almost canceled the appointment today due to the fact that he is overall doing well.  ROS: See HPI otherwise negative   Physical exam: Right hip good range of motion without pain.  He has no tenderness over the greater trochanteric region.  Ambulates without any assistive device nonantalgic gait.  Impression: Right hip pain  Plan: He will continue the Relafen as needed.  If his hip pain returns then he can call our office and we will obtain a bone scan to rule out loosening of the right total hip. Otherwise he will follow up as needed.

## 2017-12-10 ENCOUNTER — Ambulatory Visit: Payer: 59 | Admitting: Family Medicine

## 2017-12-24 ENCOUNTER — Ambulatory Visit: Payer: 59 | Admitting: Family Medicine

## 2017-12-24 ENCOUNTER — Encounter: Payer: Self-pay | Admitting: Family Medicine

## 2017-12-24 VITALS — BP 134/78 | Ht 66.0 in | Wt 270.8 lb

## 2017-12-24 DIAGNOSIS — E7849 Other hyperlipidemia: Secondary | ICD-10-CM | POA: Diagnosis not present

## 2017-12-24 DIAGNOSIS — I10 Essential (primary) hypertension: Secondary | ICD-10-CM

## 2017-12-24 MED ORDER — LISINOPRIL-HYDROCHLOROTHIAZIDE 20-12.5 MG PO TABS: 1.0000 | ORAL_TABLET | Freq: Every day | ORAL | 1 refills | Status: DC

## 2017-12-24 MED ORDER — EZETIMIBE 10 MG PO TABS: 10.0000 mg | ORAL_TABLET | Freq: Every day | ORAL | 3 refills | Status: DC

## 2017-12-24 NOTE — Progress Notes (Signed)
   Subjective:    Patient ID: Brian Salazar, male    DOB: 10-12-1955, 62 y.o.   MRN: [id number]  Hypertension  This is a chronic problem. Pertinent negatives include no chest pain, headaches or shortness of breath. Risk factors for coronary artery disease include dyslipidemia. Treatments tried: lisinopril/hctz.   No problems or concerns  Patient for blood pressure check up.  The patient does have hypertension.  The patient is on medication.  Patient relates compliance with meds. Todays BP reviewed with the patient. Patient denies issues with medication. Patient relates reasonable diet. Patient tries to minimize salt. Patient aware of BP goals.  Patient here for follow-up regarding cholesterol.  The patient does have hyperlipidemia.  Patient does try to maintain a reasonable diet.  Patient does take the medication on a regular basis.  Denies missing a dose.  The patient denies any obvious side effects.  Prior blood work results reviewed with the patient.  The patient is aware of his cholesterol goals and the need to keep it under good control to lessen the risk of disease. He is unable to tolerate statins therefore he is on Zetia  He states he could be eating better but it is difficult between work family and taking care of young grandchildren  Review of Systems  Constitutional: Negative for activity change, fatigue and fever.  HENT: Negative for congestion and rhinorrhea.   Respiratory: Negative for cough and shortness of breath.   Cardiovascular: Negative for chest pain and leg swelling.  Gastrointestinal: Negative for abdominal pain, diarrhea and nausea.  Genitourinary: Negative for dysuria and hematuria.  Neurological: Negative for weakness and headaches.  Psychiatric/Behavioral: Negative for agitation and behavioral problems.       Objective:   Physical Exam  Constitutional: He appears well-nourished. No distress.  HENT:  Head: Normocephalic and atraumatic.  Eyes: Right eye  exhibits no discharge. Left eye exhibits no discharge.  Neck: No tracheal deviation present.  Cardiovascular: Normal rate, regular rhythm and normal heart sounds.  No murmur heard. Pulmonary/Chest: Effort normal and breath sounds normal. No respiratory distress.  Musculoskeletal: He exhibits no edema.  Lymphadenopathy:    He has no cervical adenopathy.  Neurological: He is alert. Coordination normal.  Skin: Skin is warm and dry.  Psychiatric: He has a normal mood and affect. His behavior is normal.  Vitals reviewed.  Very nice patient Patient denies being depressed Unfortunately his job is ending at the end of the year Hopefully will get a different job      Assessment & Plan:  HTN- Patient was seen today as part of a visit regarding hypertension. The importance of healthy diet and regular physical activity was discussed. The importance of compliance with medications discussed.  Ideal goal is to keep blood pressure low elevated levels certainly below 124/58 when possible.  The patient was counseled that keeping blood pressure under control lessen his risk of complications.  The importance of regular follow-ups was discussed with the patient.  Low-salt diet such as DASH recommended.  Regular physical activity was recommended as well.  Patient was advised to keep regular follow-ups.  Morbid obesity patient is trying to watch diet try to lose some weight  Hyperlipidemia check lab work continue medication.  Watch diet closely

## 2018-01-01 DIAGNOSIS — I10 Essential (primary) hypertension: Secondary | ICD-10-CM | POA: Diagnosis not present

## 2018-01-01 DIAGNOSIS — E7849 Other hyperlipidemia: Secondary | ICD-10-CM | POA: Diagnosis not present

## 2018-01-02 LAB — BASIC METABOLIC PANEL
BUN / CREAT RATIO: 19 (ref 10–24)
BUN: 21 mg/dL (ref 8–27)
CO2: 22 mmol/L (ref 20–29)
Calcium: 9 mg/dL (ref 8.6–10.2)
Chloride: 101 mmol/L (ref 96–106)
Creatinine, Ser: 1.12 mg/dL (ref 0.76–1.27)
GFR calc Af Amer: 82 mL/min/{1.73_m2} (ref >59)
GFR calc non Af Amer: 71 mL/min/{1.73_m2} (ref >59)
Glucose: 98 mg/dL (ref 65–99)
Potassium: 4.6 mmol/L (ref 3.5–5.2)
Sodium: 139 mmol/L (ref 134–144)

## 2018-01-02 LAB — LIPID PANEL
CHOLESTEROL TOTAL: 201 mg/dL — AB (ref 100–199)
Chol/HDL Ratio: 6.7 ratio — ABNORMAL HIGH (ref 0.0–5.0)
HDL: 30 mg/dL — ABNORMAL LOW (ref >39)
LDL Calculated: 129 mg/dL — ABNORMAL HIGH (ref 0–99)
Triglycerides: 209 mg/dL — ABNORMAL HIGH (ref 0–149)
VLDL Cholesterol Cal: 42 mg/dL — ABNORMAL HIGH (ref 5–40)

## 2018-06-24 ENCOUNTER — Other Ambulatory Visit: Payer: Self-pay | Admitting: *Deleted

## 2018-06-24 MED ORDER — LISINOPRIL-HYDROCHLOROTHIAZIDE 20-12.5 MG PO TABS: 1.0000 | ORAL_TABLET | Freq: Every day | ORAL | 0 refills | Status: DC

## 2018-07-07 ENCOUNTER — Encounter: Payer: Self-pay | Admitting: Gastroenterology

## 2018-08-19 ENCOUNTER — Other Ambulatory Visit: Payer: Self-pay

## 2018-08-19 ENCOUNTER — Ambulatory Visit (INDEPENDENT_AMBULATORY_CARE_PROVIDER_SITE_OTHER): Payer: BC Managed Care – PPO | Admitting: Family Medicine

## 2018-08-19 DIAGNOSIS — B019 Varicella without complication: Secondary | ICD-10-CM | POA: Diagnosis not present

## 2018-08-19 DIAGNOSIS — R21 Rash and other nonspecific skin eruption: Secondary | ICD-10-CM | POA: Diagnosis not present

## 2018-08-19 DIAGNOSIS — W57XXXA Bitten or stung by nonvenomous insect and other nonvenomous arthropods, initial encounter: Secondary | ICD-10-CM

## 2018-08-19 MED ORDER — HYDROXYZINE PAMOATE 25 MG PO CAPS: ORAL_CAPSULE | ORAL | 1 refills | Status: DC

## 2018-08-19 MED ORDER — PREDNISONE 20 MG PO TABS: ORAL_TABLET | ORAL | 0 refills | Status: DC

## 2018-08-19 MED ORDER — VALACYCLOVIR HCL 1 G PO TABS: 1000.0000 mg | ORAL_TABLET | Freq: Three times a day (TID) | ORAL | 0 refills | Status: DC

## 2018-08-19 NOTE — Progress Notes (Signed)
   Subjective:    Patient ID: Brian Salazar, male    DOB: 07/09/1955, 63 y.o.   MRN: [id number]  HPI Pt believes he has shingles. Pt having individual spots that look like chicken pox or chiggers or poison oak.  Itching off and on. Pt states that the spots are all over. Tried Benadryl but no relief. Clamine lotion also used but no help. Pt had them since last Thursday or Friday; pt went into woods one of those days. Pt denies any fever, no nausea/vomiting. This patient denies any tick bites.  Denies muscle aches.  A lot of itching.  A couple of these spots did have small blisters on it. Virtual Visit via Video Note  I connected with Brian Salazar on 08/19/18 at  4:10 PM EDT by a video enabled telemedicine application and verified that I am speaking with the correct person using two identifiers.  Location: Patient: home Provider: office    I discussed the limitations of evaluation and management by telemedicine and the availability of in person appointments. The patient expressed understanding and agreed to proceed.  History of Present Illness:    Observations/Objective:   Assessment and Plan:   Follow Up Instructions:    I discussed the assessment and treatment plan with the patient. The patient was provided an opportunity to ask questions and all were answered. The patient agreed with the plan and demonstrated an understanding of the instructions.   The patient was advised to call back or seek an in-person evaluation if the symptoms worsen or if the condition fails to improve as anticipated.  I provided 15 minutes of non-face-to-face time during this encounter.   Brian Males, LPN   Review of Systems  Constitutional: Negative for activity change, fatigue and fever.  HENT: Negative for congestion and rhinorrhea.   Respiratory: Negative for cough and shortness of breath.   Cardiovascular: Negative for chest pain and leg swelling.  Gastrointestinal: Negative for abdominal  pain, diarrhea and nausea.  Genitourinary: Negative for dysuria and hematuria.  Neurological: Negative for weakness and headaches.  Psychiatric/Behavioral: Negative for agitation and behavioral problems.       Objective:   Physical Exam  Patient had virtual visit Appears to be in no distress Atraumatic Neuro able to relate and oriented No apparent resp distress Color normal Video visit with the wife but unfortunately patient was not present at the time of the video visit but he did send me multiple pictures that I reviewed and I called and talked with him regarding about the pictures they appear to be excoriated red dots and blotches with 1 of them having a small blister.      Assessment & Plan:  Probable bug bites Low likelihood of atypical chickenpox Valtrex 3 times daily for 7 days Prednisone short course I recommend 2/day for the next 5 days Vistaril for itching caution drowsiness Warnings discussed follow-up if problems

## 2018-08-29 ENCOUNTER — Telehealth: Payer: Self-pay | Admitting: Family Medicine

## 2018-08-29 NOTE — Telephone Encounter (Signed)
Pt states that all 4 of his grandchildren are now getting bumps like his. Pt still has some of the bumps and is getting more. Pt states that he has finished the Prednisone and is not sure if he needs another round of steroids or not. Pt would prefer something other than Prednisone if a steroid has to be called in. Grandchildren live with patient and pt was unable to isolate himself. Please advise. Thank you   [facility].  [place]

## 2018-08-29 NOTE — Telephone Encounter (Signed)
Contacted pt wife. Pt wife would like all patients seen. Informed wife that we would call them in the morning with an appt due to everyone up front already gone. Pt wife verbalized understanding

## 2018-08-29 NOTE — Telephone Encounter (Signed)
Certainly sympathetic to what is going on Will need to either see the patient in person or patient will need to be seen by dermatology this week Please talk with the patient and then we can go from there As for the kids they would need to be seen as well I truly do not want to treat them presumptively/or guessing

## 2018-08-30 ENCOUNTER — Ambulatory Visit (INDEPENDENT_AMBULATORY_CARE_PROVIDER_SITE_OTHER): Payer: BC Managed Care – PPO | Admitting: Family Medicine

## 2018-08-30 ENCOUNTER — Other Ambulatory Visit: Payer: Self-pay

## 2018-08-30 VITALS — Temp 97.7°F | Wt 241.6 lb

## 2018-08-30 DIAGNOSIS — B86 Scabies: Secondary | ICD-10-CM | POA: Diagnosis not present

## 2018-08-30 DIAGNOSIS — L209 Atopic dermatitis, unspecified: Secondary | ICD-10-CM | POA: Diagnosis not present

## 2018-08-30 MED ORDER — PERMETHRIN 5 % EX CREA: 1.0000 "application " | TOPICAL_CREAM | Freq: Once | CUTANEOUS | 1 refills | Status: AC

## 2018-08-30 NOTE — Progress Notes (Signed)
   Subjective:    Patient ID: Brian Salazar, male    DOB: 11-Feb-1955, 63 y.o.   MRN: [id number]  Rash This is a recurrent problem. The current episode started in the past 7 days. Location: pt was seen on 08/19/2018 and was having spots all over body; new places have appeared on back and the old areas have not gone away. The rash is characterized by itchiness.   Significant rash on the upper chest also on the upper shoulder blade region and also noted some on the legs and torso region and back   Review of Systems  Skin: Positive for rash.  No fevers chills sweats diarrhea vomiting no cough wheezing difficulty breathing shortness of breath     Objective:   Physical Exam  Does not appear to be in any distress multiple areas or rashes noted on upper back to lower back legs and arms none on the hands      Assessment & Plan:  Definitely not shingles Possibly atopic dermatitis Prednisone did not help much Patient does not feel prednisone helped any It is possible this could be scabies I recommend Elimite cream apply now and do it again in 1 week if not doing dramatically better notify us and we will help set up with dermatology

## 2018-08-31 ENCOUNTER — Ambulatory Visit: Payer: BC Managed Care – PPO | Admitting: Family Medicine

## 2018-09-28 ENCOUNTER — Other Ambulatory Visit: Payer: Self-pay | Admitting: Family Medicine

## 2018-09-29 NOTE — Telephone Encounter (Signed)
Needs virtual or in person visit scheduled for this fall may have 1 refill

## 2018-09-30 NOTE — Telephone Encounter (Signed)
Patient scheduled appointment for 9/24

## 2018-10-13 ENCOUNTER — Ambulatory Visit (INDEPENDENT_AMBULATORY_CARE_PROVIDER_SITE_OTHER): Payer: BC Managed Care – PPO | Admitting: Family Medicine

## 2018-10-13 ENCOUNTER — Other Ambulatory Visit: Payer: Self-pay

## 2018-10-13 DIAGNOSIS — E7849 Other hyperlipidemia: Secondary | ICD-10-CM | POA: Diagnosis not present

## 2018-10-13 DIAGNOSIS — I10 Essential (primary) hypertension: Secondary | ICD-10-CM

## 2018-10-13 DIAGNOSIS — Z125 Encounter for screening for malignant neoplasm of prostate: Secondary | ICD-10-CM

## 2018-10-13 MED ORDER — LISINOPRIL-HYDROCHLOROTHIAZIDE 20-12.5 MG PO TABS: 1.0000 | ORAL_TABLET | Freq: Every day | ORAL | 2 refills | Status: DC

## 2018-10-13 NOTE — Progress Notes (Signed)
   Subjective:    Patient ID: Brian Salazar, male    DOB: October 09, 1955, 63 y.o.   MRN: [id number]  HPI Pt here today for medication follow up. Pt states he is only taking Lisinopril 20-12.5 mg daily. Pt states no issues that he is aware of. Does not check blood pressure regularly.  Patient does take his medicine regular basis denies any problems  Has morbid obesity trying to lose weight Colonoscopy recommended patient defers till spring Patient does have a history of cholesterol elevated he is willing to check it again Virtual Visit via Video Note  I connected with Brian Salazar on 10/13/18 at 11:00 AM EDT by a video enabled telemedicine application and verified that I am speaking with the correct person using two identifiers.  Location: Patient: home Provider: office   I discussed the limitations of evaluation and management by telemedicine and the availability of in person appointments. The patient expressed understanding and agreed to proceed.  History of Present Illness:    Observations/Objective:   Assessment and Plan:   Follow Up Instructions:    I discussed the assessment and treatment plan with the patient. The patient was provided an opportunity to ask questions and all were answered. The patient agreed with the plan and demonstrated an understanding of the instructions.   The patient was advised to call back or seek an in-person evaluation if the symptoms worsen or if the condition fails to improve as anticipated.  I provided 15 minutes of non-face-to-face time during this encounter.   Brian Males, LPN     Review of Systems  Constitutional: Negative for diaphoresis and fatigue.  HENT: Negative for congestion and rhinorrhea.   Respiratory: Negative for cough and shortness of breath.   Cardiovascular: Negative for chest pain and leg swelling.  Gastrointestinal: Negative for abdominal pain and diarrhea.  Skin: Negative for color change and rash.   Neurological: Negative for dizziness and headaches.  Psychiatric/Behavioral: Negative for behavioral problems and confusion.       Objective:   Physical Exam   Patient had virtual visit Appears to be in no distress Atraumatic Neuro able to relate and oriented No apparent resp distress Color normal      Assessment & Plan:  Hypertension patient states under good control take medicine watching diet continue current measures  Patient states he will do lab work to look at cholesterol and other measures  Flu shot recommended  15 minutes was spent with patient today discussing healthcare issues which they came.  More than 50% of this visit-total duration of visit-was spent in counseling and coordination of care.  Please see diagnosis regarding the focus of this coordination and care

## 2019-08-28 DIAGNOSIS — Z23 Encounter for immunization: Secondary | ICD-10-CM | POA: Diagnosis not present

## 2019-12-08 ENCOUNTER — Encounter: Payer: Self-pay | Admitting: Family Medicine

## 2019-12-08 ENCOUNTER — Other Ambulatory Visit: Payer: Self-pay | Admitting: *Deleted

## 2019-12-08 DIAGNOSIS — Z125 Encounter for screening for malignant neoplasm of prostate: Secondary | ICD-10-CM

## 2019-12-08 DIAGNOSIS — I10 Essential (primary) hypertension: Secondary | ICD-10-CM

## 2019-12-08 DIAGNOSIS — E7849 Other hyperlipidemia: Secondary | ICD-10-CM

## 2019-12-08 DIAGNOSIS — Z79899 Other long term (current) drug therapy: Secondary | ICD-10-CM

## 2019-12-13 DIAGNOSIS — E7849 Other hyperlipidemia: Secondary | ICD-10-CM | POA: Diagnosis not present

## 2019-12-13 DIAGNOSIS — Z79899 Other long term (current) drug therapy: Secondary | ICD-10-CM | POA: Diagnosis not present

## 2019-12-13 DIAGNOSIS — I10 Essential (primary) hypertension: Secondary | ICD-10-CM | POA: Diagnosis not present

## 2019-12-13 DIAGNOSIS — Z125 Encounter for screening for malignant neoplasm of prostate: Secondary | ICD-10-CM | POA: Diagnosis not present

## 2019-12-14 LAB — LIPID PANEL
Chol/HDL Ratio: 7.6 ratio — ABNORMAL HIGH (ref 0.0–5.0)
Cholesterol, Total: 236 mg/dL — ABNORMAL HIGH (ref 100–199)
HDL: 31 mg/dL — ABNORMAL LOW (ref >39)
LDL Chol Calc (NIH): 180 mg/dL — ABNORMAL HIGH (ref 0–99)
Triglycerides: 135 mg/dL (ref 0–149)
VLDL Cholesterol Cal: 25 mg/dL (ref 5–40)

## 2019-12-14 LAB — HEPATIC FUNCTION PANEL
ALT: 30 IU/L (ref 0–44)
AST: 22 IU/L (ref 0–40)
Albumin: 4 g/dL (ref 3.8–4.8)
Alkaline Phosphatase: 85 IU/L (ref 44–121)
Bilirubin Total: 0.5 mg/dL (ref 0.0–1.2)
Bilirubin, Direct: 0.12 mg/dL (ref 0.00–0.40)
Total Protein: 6.5 g/dL (ref 6.0–8.5)

## 2019-12-14 LAB — BASIC METABOLIC PANEL
BUN/Creatinine Ratio: 21 (ref 10–24)
BUN: 18 mg/dL (ref 8–27)
CO2: 22 mmol/L (ref 20–29)
Calcium: 9 mg/dL (ref 8.6–10.2)
Chloride: 101 mmol/L (ref 96–106)
Creatinine, Ser: 0.84 mg/dL (ref 0.76–1.27)
GFR calc Af Amer: 108 mL/min/{1.73_m2} (ref >59)
GFR calc non Af Amer: 94 mL/min/{1.73_m2} (ref >59)
Glucose: 107 mg/dL — ABNORMAL HIGH (ref 65–99)
Potassium: 4.1 mmol/L (ref 3.5–5.2)
Sodium: 139 mmol/L (ref 134–144)

## 2019-12-14 LAB — PSA: Prostate Specific Ag, Serum: 4.4 ng/mL — ABNORMAL HIGH (ref 0.0–4.0)

## 2019-12-18 ENCOUNTER — Other Ambulatory Visit: Payer: Self-pay | Admitting: Family Medicine

## 2019-12-18 DIAGNOSIS — R972 Elevated prostate specific antigen [PSA]: Secondary | ICD-10-CM

## 2019-12-18 NOTE — Progress Notes (Signed)
error 

## 2019-12-19 ENCOUNTER — Encounter: Payer: Self-pay | Admitting: Family Medicine

## 2019-12-19 ENCOUNTER — Other Ambulatory Visit: Payer: Self-pay | Admitting: *Deleted

## 2019-12-19 MED ORDER — LISINOPRIL-HYDROCHLOROTHIAZIDE 20-12.5 MG PO TABS: 1.0000 | ORAL_TABLET | Freq: Every day | ORAL | 0 refills | Status: DC

## 2019-12-31 DIAGNOSIS — Z20822 Contact with and (suspected) exposure to covid-19: Secondary | ICD-10-CM | POA: Diagnosis not present

## 2019-12-31 DIAGNOSIS — B349 Viral infection, unspecified: Secondary | ICD-10-CM | POA: Diagnosis not present

## 2020-01-14 DIAGNOSIS — Z20822 Contact with and (suspected) exposure to covid-19: Secondary | ICD-10-CM | POA: Diagnosis not present

## 2020-01-24 ENCOUNTER — Telehealth (INDEPENDENT_AMBULATORY_CARE_PROVIDER_SITE_OTHER): Payer: No Typology Code available for payment source | Admitting: Family Medicine

## 2020-01-24 ENCOUNTER — Telehealth: Payer: Self-pay | Admitting: Family Medicine

## 2020-01-24 ENCOUNTER — Other Ambulatory Visit: Payer: Self-pay

## 2020-01-24 DIAGNOSIS — E7849 Other hyperlipidemia: Secondary | ICD-10-CM | POA: Diagnosis not present

## 2020-01-24 DIAGNOSIS — R972 Elevated prostate specific antigen [PSA]: Secondary | ICD-10-CM

## 2020-01-24 DIAGNOSIS — I10 Essential (primary) hypertension: Secondary | ICD-10-CM

## 2020-01-24 DIAGNOSIS — R7301 Impaired fasting glucose: Secondary | ICD-10-CM

## 2020-01-24 MED ORDER — LISINOPRIL-HYDROCHLOROTHIAZIDE 20-12.5 MG PO TABS: 1.0000 | ORAL_TABLET | Freq: Every day | ORAL | 1 refills | Status: DC

## 2020-01-24 MED ORDER — ROSUVASTATIN CALCIUM 5 MG PO TABS: ORAL_TABLET | ORAL | 1 refills | Status: DC

## 2020-01-24 NOTE — Telephone Encounter (Signed)
[name], pletz are scheduled for a virtual visit with your provider today.    Just as we do with appointments in the office, we must obtain your consent to participate.  Your consent will be active for this visit and any virtual visit you may have with one of our providers in the next 365 days.    If you have a MyChart account, I can also send a copy of this consent to you electronically.  All virtual visits are billed to your insurance company just like a traditional visit in the office.  As this is a virtual visit, video technology does not allow for your provider to perform a traditional examination.  This may limit your provider's ability to fully assess your condition.  If your provider identifies any concerns that need to be evaluated in person or the need to arrange testing such as labs, EKG, etc, we will make arrangements to do so.    Although advances in technology are sophisticated, we cannot ensure that it will always work on either your end or our end.  If the connection with a video visit is poor, we may have to switch to a telephone visit.  With either a video or telephone visit, we are not always able to ensure that we have a secure connection.   I need to obtain your verbal consent now.   Are you willing to proceed with your visit today?   Brian Salazar has provided verbal consent on 01/24/2020 for a virtual visit (video or telephone).   Marlowe [place], LPN 06/24/628  1:60 AM

## 2020-01-24 NOTE — Progress Notes (Signed)
Subjective:    Patient ID: Brian Salazar, male    DOB: 10/13/1955, 65 y.o.   MRN: [id number]  HPI Pt needing to discuss recent lab work. Pt had abnormal labs in November. Video visit We did discuss his cholesterol how this puts him at high risk for heart disease Also discussed his PSA being elevated indicating the possibility of prostate cancer but needs further looking into  Pt states [facility] has contacted him and has an appt set up. He was suppose to receive paper work from them but has not at this time.   Patient had unexpected elevated PSA not having any symptoms.  Therefore patient will be seeing [facility] for further evaluation  Cholesterol significantly elevated risk of heart disease 18.6% based upon all this patient will go ahead and start with a low-dose statin previously simvastatin caused him to have side effects we will gradually titrate up on rosuvastatin with the goal of getting LDL in a better range repeat lab work again in 3 months time  Fasting glucose slightly elevated.  We will do a follow-up glucose and A1c in 3 months time when he does his lab work  Patient will follow up in 6 months Results for orders placed or performed in visit on [id number]  Basic metabolic panel  Result Value Ref Range   Glucose 107 (H) 65 - 99 mg/dL   BUN 18 8 - 27 mg/dL   Creatinine, Ser 0.84 0.76 - 1.27 mg/dL   GFR calc non Af Amer 94 >59 mL/min/1.73   GFR calc Af Amer 108 >59 mL/min/1.73   BUN/Creatinine Ratio 21 10 - 24   Sodium 139 134 - 144 mmol/L   Potassium 4.1 3.5 - 5.2 mmol/L   Chloride 101 96 - 106 mmol/L   CO2 22 20 - 29 mmol/L   Calcium 9.0 8.6 - 10.2 mg/dL  Lipid panel  Result Value Ref Range   Cholesterol, Total 236 (H) 100 - 199 mg/dL   Triglycerides 135 0 - 149 mg/dL   HDL 31 (L) >39 mg/dL   VLDL Cholesterol Cal 25 5 - 40 mg/dL   LDL Chol Calc (NIH) 180 (H) 0 - 99 mg/dL   Chol/HDL Ratio 7.6 (H) 0.0 - 5.0 ratio  Hepatic function panel  Result Value  Ref Range   Total Protein 6.5 6.0 - 8.5 g/dL   Albumin 4.0 3.8 - 4.8 g/dL   Bilirubin Total 0.5 0.0 - 1.2 mg/dL   Bilirubin, Direct 0.12 0.00 - 0.40 mg/dL   Alkaline Phosphatase 85 44 - 121 IU/L   AST 22 0 - 40 IU/L   ALT 30 0 - 44 IU/L  PSA  Result Value Ref Range   Prostate Specific Ag, Serum 4.4 (H) 0.0 - 4.0 ng/mL    Virtual Visit via Video Note  I connected with Brian Salazar on 01/24/20 at  9:00 AM EST by a video enabled telemedicine application and verified that I am speaking with the correct person using two identifiers.  Location: Patient: home Provider: office   I discussed the limitations of evaluation and management by telemedicine and the availability of in person appointments. The patient expressed understanding and agreed to proceed.  History of Present Illness:    Observations/Objective:   Assessment and Plan:   Follow Up Instructions:    I discussed the assessment and treatment plan with the patient. The patient was provided an opportunity to ask questions and all were answered. The patient agreed with the  plan and demonstrated an understanding of the instructions.   The patient was advised to call back or seek an in-person evaluation if the symptoms worsen or if the condition fails to improve as anticipated.  I provided 25 including chart review documentation and time spent with patient minutes of non-face-to-face time during this encounter.       Review of Systems  Constitutional: Negative for diaphoresis and fatigue.  HENT: Negative for congestion and rhinorrhea.   Respiratory: Negative for cough and shortness of breath.   Cardiovascular: Negative for chest pain and leg swelling.  Gastrointestinal: Negative for abdominal pain and diarrhea.  Skin: Negative for color change and rash.  Neurological: Negative for dizziness and headaches.  Psychiatric/Behavioral: Negative for behavioral problems and confusion.       Objective:    Patient had  virtual visit-video Appears to be in no distress Atraumatic Neuro able to relate and oriented No apparent resp distress Color normal      Assessment & Plan:  Elevated cholesterol LDL elevated In the past did not tolerate simvastatin Based upon all of this we will go with rosuvastatin low-dose daily Initially patient will start off 2 days a week and gradually titrate up Repeat blood work again in approximately 3 months  Follow-up office visit in 6 months  Patient will be seeing [facility] for elevated PSA and further evaluation of this  Patient will let us know if any additional questions concerns or issues  HTN- patient seen for follow-up regarding HTN.  Diet, medication compliance, appropriate labs and refills were completed.  Importance of keeping blood pressure under good control to lessen the risk of complications discussed  Fasting hyperglycemia patient will minimize starches in the diet stay physically active we will check an A1c in approximately 3 months

## 2020-08-12 ENCOUNTER — Telehealth: Payer: Self-pay | Admitting: Family Medicine

## 2020-08-12 MED ORDER — ROSUVASTATIN CALCIUM 5 MG PO TABS: ORAL_TABLET | ORAL | 0 refills | Status: DC

## 2020-08-12 NOTE — Telephone Encounter (Signed)
Sent mychart message

## 2020-08-12 NOTE — Telephone Encounter (Signed)
Pt is needing a refill on   rosuvastatin (CRESTOR) 5 MG tablet  Pt scheduled an app on 09/05/2020 to have a medication refill app    [place], [place]

## 2020-08-12 NOTE — Telephone Encounter (Signed)
Prescription sent electronically to pharmacy. Patient notified. 

## 2020-08-12 NOTE — Telephone Encounter (Signed)
Lab draw and appt needed per last encounter notes

## 2020-08-17 LAB — GLUCOSE, RANDOM: Glucose: 101 mg/dL — ABNORMAL HIGH (ref 65–99)

## 2020-08-17 LAB — HEMOGLOBIN A1C
Est. average glucose Bld gHb Est-mCnc: 117 mg/dL
Hgb A1c MFr Bld: 5.7 % — ABNORMAL HIGH (ref 4.8–5.6)

## 2020-08-17 LAB — LIPID PANEL
Chol/HDL Ratio: 5.6 ratio — ABNORMAL HIGH (ref 0.0–5.0)
Cholesterol, Total: 191 mg/dL (ref 100–199)
HDL: 34 mg/dL — ABNORMAL LOW (ref >39)
LDL Chol Calc (NIH): 129 mg/dL — ABNORMAL HIGH (ref 0–99)
Triglycerides: 155 mg/dL — ABNORMAL HIGH (ref 0–149)
VLDL Cholesterol Cal: 28 mg/dL (ref 5–40)

## 2020-08-17 LAB — HEPATIC FUNCTION PANEL
ALT: 40 IU/L (ref 0–44)
AST: 29 IU/L (ref 0–40)
Albumin: 4.3 g/dL (ref 3.8–4.8)
Alkaline Phosphatase: 87 IU/L (ref 44–121)
Bilirubin Total: 0.3 mg/dL (ref 0.0–1.2)
Bilirubin, Direct: <0.1 mg/dL (ref 0.00–0.40)
Total Protein: 6.8 g/dL (ref 6.0–8.5)

## 2020-08-29 ENCOUNTER — Ambulatory Visit: Payer: No Typology Code available for payment source | Admitting: Family Medicine

## 2020-09-05 ENCOUNTER — Ambulatory Visit: Payer: No Typology Code available for payment source | Admitting: Family Medicine

## 2020-09-05 ENCOUNTER — Encounter: Payer: Self-pay | Admitting: Family Medicine

## 2020-09-05 ENCOUNTER — Other Ambulatory Visit: Payer: Self-pay

## 2020-09-05 VITALS — BP 118/72 | HR 80 | Temp 97.7°F | Ht 66.0 in | Wt 241.4 lb

## 2020-09-05 DIAGNOSIS — E7849 Other hyperlipidemia: Secondary | ICD-10-CM | POA: Diagnosis not present

## 2020-09-05 DIAGNOSIS — R972 Elevated prostate specific antigen [PSA]: Secondary | ICD-10-CM | POA: Diagnosis not present

## 2020-09-05 DIAGNOSIS — I10 Essential (primary) hypertension: Secondary | ICD-10-CM

## 2020-09-05 MED ORDER — LISINOPRIL-HYDROCHLOROTHIAZIDE 20-12.5 MG PO TABS: 1.0000 | ORAL_TABLET | Freq: Every day | ORAL | 5 refills | Status: AC

## 2020-09-05 MED ORDER — ROSUVASTATIN CALCIUM 10 MG PO TABS: 10.0000 mg | ORAL_TABLET | Freq: Every day | ORAL | 5 refills | Status: AC

## 2020-09-05 NOTE — Progress Notes (Signed)
   Subjective:    Patient ID: Brian Salazar, male    DOB: April 26, 1955, 65 y.o.   MRN: [id number]  Hypertension This is a chronic problem. The current episode started more than 1 year ago. Risk factors for coronary artery disease include dyslipidemia. Treatments tried: zestoretic. There are no compliance problems.    History of elevated PSA urology wanted to do a prostate biopsy but was not going to do any sedation patient deferred on this  Patient is taking cholesterol medicine low-dose and tolerating it well LDL has gone down significantly but still not at goal we did discuss this  Weight discussed healthy diet regular activity patient states he did lose a lot of weight but then it went back up Results for orders placed or performed in visit on 01/24/20  Glucose  Result Value Ref Range   Glucose 101 (H) 65 - 99 mg/dL  Hemoglobin A1c  Result Value Ref Range   Hgb A1c MFr Bld 5.7 (H) 4.8 - 5.6 %   Est. average glucose Bld gHb Est-mCnc 117 mg/dL  Lipid Profile  Result Value Ref Range   Cholesterol, Total 191 100 - 199 mg/dL   Triglycerides 155 (H) 0 - 149 mg/dL   HDL 34 (L) >39 mg/dL   VLDL Cholesterol Cal 28 5 - 40 mg/dL   LDL Chol Calc (NIH) 129 (H) 0 - 99 mg/dL   Chol/HDL Ratio 5.6 (H) 0.0 - 5.0 ratio  Hepatic function panel  Result Value Ref Range   Total Protein 6.8 6.0 - 8.5 g/dL   Albumin 4.3 3.8 - 4.8 g/dL   Bilirubin Total 0.3 0.0 - 1.2 mg/dL   Bilirubin, Direct <0.10 0.00 - 0.40 mg/dL   Alkaline Phosphatase 87 44 - 121 IU/L   AST 29 0 - 40 IU/L   ALT 40 0 - 44 IU/L     Review of Systems     Objective:   Physical Exam  General-in no acute distress Eyes-no discharge Lungs-respiratory rate normal, CTA CV-no murmurs,RRR Extremities skin warm dry no edema Neuro grossly normal Behavior normal, alert  When listening to his heart in several different places I did not hear a murmur today     Assessment & Plan:  1. Essential hypertension, benign Blood pressure  is under good control we will check a metabolic 7 continue current medication follow-up in 6 months  2. Other hyperlipidemia Cholesterol much better than what it was we will try bumping up the dose to 10 mg patient agrees to this he will let us know if he is not tolerating the newer dose follow-up labs again in 6 months  3. Elevated PSA Patient did not follow through with urology did not want to do the biopsy.  I have encouraged him to go ahead and recheck his PSA to make sure its not getting out of control we will check a PSA and free PSA then communicate with patient accordingly

## 2020-09-11 NOTE — Telephone Encounter (Signed)
Patient had medication follow up on 8/18

## 2020-09-12 LAB — PSA, TOTAL AND FREE
PSA, Free Pct: 18.6 %
PSA, Free: 0.78 ng/mL
Prostate Specific Ag, Serum: 4.2 ng/mL — ABNORMAL HIGH (ref 0.0–4.0)

## 2020-09-12 LAB — BASIC METABOLIC PANEL
BUN/Creatinine Ratio: 21 (ref 10–24)
BUN: 20 mg/dL (ref 8–27)
CO2: 20 mmol/L (ref 20–29)
Calcium: 9 mg/dL (ref 8.6–10.2)
Chloride: 101 mmol/L (ref 96–106)
Creatinine, Ser: 0.95 mg/dL (ref 0.76–1.27)
Glucose: 92 mg/dL (ref 65–99)
Potassium: 4.3 mmol/L (ref 3.5–5.2)
Sodium: 138 mmol/L (ref 134–144)
eGFR: 90 mL/min/{1.73_m2} (ref >59)

## 2020-10-15 ENCOUNTER — Encounter: Payer: Self-pay | Admitting: Family Medicine

## 2020-11-03 ENCOUNTER — Encounter: Payer: Self-pay | Admitting: Family Medicine

## 2021-02-19 ENCOUNTER — Other Ambulatory Visit: Payer: Self-pay

## 2021-02-19 ENCOUNTER — Ambulatory Visit (INDEPENDENT_AMBULATORY_CARE_PROVIDER_SITE_OTHER): Payer: 59

## 2021-02-19 ENCOUNTER — Ambulatory Visit (INDEPENDENT_AMBULATORY_CARE_PROVIDER_SITE_OTHER): Payer: 59 | Admitting: Orthopaedic Surgery

## 2021-02-19 VITALS — Ht 66.0 in | Wt 248.0 lb

## 2021-02-19 DIAGNOSIS — M1612 Unilateral primary osteoarthritis, left hip: Secondary | ICD-10-CM | POA: Diagnosis not present

## 2021-02-19 DIAGNOSIS — M25552 Pain in left hip: Secondary | ICD-10-CM

## 2021-02-19 NOTE — Progress Notes (Signed)
Office Visit Note   Patient: Brian Salazar           Date of Birth: 1955/07/28           MRN: [id number] Visit Date: 02/19/2021              Requested by: Sueanne Margarita, [place],  [place] [address] PCP: Sueanne Margarita, DO   Assessment & Plan: Visit Diagnoses:  1. Pain in left hip   2. Unilateral primary osteoarthritis, left hip     Plan: At this point given the failure of conservative treatment and the severity of his left hip arthritis and pain, we are recommending a left anterior hip replacement.  Having had this before on the right side he is fully aware of the risks and benefits of surgery and what to expect with intraoperative and postoperative course.  All questions and concerns were answered and addressed.  We will work on getting him scheduled for left hip replacement.  Follow-Up Instructions: Return for 2 weeks post-op.   Orders:  Orders Placed This Encounter  Procedures   XR HIP UNILAT W OR W/O PELVIS 1V LEFT   No orders of the defined types were placed in this encounter.     Procedures: No procedures performed   Clinical Data: No additional findings.   Subjective: Chief Complaint  Patient presents with   Left Hip - Pain  The patient is well-known to me.  He is a 66 year old gentleman who we replaced his right hip 9 years ago.  That hip is done well.  We last saw him in 2019 for his left hip and even then his left hip was arthritic.  Now it is gotten worse than his left hip pain is daily.  He has stiffness with the left hip and decreased rotation.  At this point his left hip pain is definitely affecting his mobility, his quality of life and his actives daily living.  He is worked on weight loss.  He has had no acute changes in medical status.  He has tried activity modification and offloading that hip.  He has been on anti-inflammatories as well.  At this point he is interested in left hip replacement surgery and I discussed this with him even in  2019.  HPI  Review of Systems There is currently listed no headache, chest pain, shortness of breath, fever, chills, nausea, vomiting  Objective: Vital Signs: Ht 5\' 6"  (1.676 m)    Wt 248 lb (112.5 kg)    BMI 40.03 kg/m   Physical Exam He is alert and orient x3 and in no acute distress Ortho Exam His right previous hip replacement moves smoothly and fluidly with no pain at all.  His left hip has significant stiffness and I cannot fully rotate his left hip.  There is significant pain in the groin with hip rotation on the left side. Specialty Comments:  No specialty comments available.  Imaging: XR HIP UNILAT W OR W/O PELVIS 1V LEFT  Result Date: 02/19/2021 An AP pelvis and lateral left hip shows a well-seated right total hip arthroplasty.  The left hip has severe end-stage arthritis with complete bone-on-bone wear and loss of the superior lateral joint space.  There is also osteophytes around the left hip joint.    PMFS History: Patient Active Problem List   Diagnosis Date Noted   Unilateral primary osteoarthritis, left hip 02/19/2021   History of total right hip replacement 07/20/2017   Morbid obesity ([place])  73/56/7014   Umbilical hernia without obstruction and without gangrene 06/22/2014   Degenerative arthritis of hip, right 09/16/2012   Essential hypertension, benign 08/30/2012   Hyperlipidemia 08/30/2012   Arthralgia of hip 08/30/2012   Past Medical History:  Diagnosis Date   Arthritis    OA RIGHT HIP   GERD (gastroesophageal reflux disease)    occas   Hypertension    Shortness of breath    with exertion    No family history on file.  Past Surgical History:  Procedure Laterality Date   BIOPSY  07/28/2013   Procedure: BIOPSY;  Surgeon: Danie Binder, MD;  Location: [facility];  Service: Endoscopy;;   COLONOSCOPY     COLONOSCOPY N/A 07/28/2013   Procedure: COLONOSCOPY;  Surgeon: Danie Binder, MD;  Location: [facility];  Service: Endoscopy;  Laterality: N/A;   10:45 AM   POLYPECTOMY  07/28/2013   Procedure: POLYPECTOMY;  Surgeon: Danie Binder, MD;  Location: [facility];  Service: Endoscopy;;   TOTAL HIP ARTHROPLASTY Right 09/16/2012   Procedure: RIGHT TOTAL HIP ARTHROPLASTY ANTERIOR APPROACH;  Surgeon: Mcarthur Rossetti, MD;  Location: [facility];  Service: Orthopedics;  Laterality: Right;  Right Total Hip Arthroplasty, Anterior Approach   Social History   Occupational History   Not on file  Tobacco Use   Smoking status: Never   Smokeless tobacco: Never  Substance and Sexual Activity   Alcohol use: Yes    Comment: RARE ALCOHOL   Drug use: No   Sexual activity: Not on file

## 2021-03-07 ENCOUNTER — Other Ambulatory Visit: Payer: Self-pay

## 2021-03-24 ENCOUNTER — Telehealth: Payer: Self-pay | Admitting: Orthopaedic Surgery

## 2021-03-24 NOTE — Telephone Encounter (Signed)
[employer] forms received. To [employer]. 

## 2021-04-10 ENCOUNTER — Other Ambulatory Visit: Payer: Self-pay | Admitting: Physician Assistant

## 2021-04-18 NOTE — Patient Instructions (Addendum)
DUE TO COVID-19 ONLY ONE VISITOR  (aged 64 and older)  IS ALLOWED TO COME WITH YOU AND STAY IN THE WAITING ROOM ONLY DURING PRE OP AND PROCEDURE.   ?**NO VISITORS ARE ALLOWED IN THE SHORT STAY AREA OR RECOVERY ROOM!!** ? ?IF YOU WILL BE ADMITTED INTO THE HOSPITAL YOU ARE ALLOWED ONLY TWO SUPPORT PEOPLE DURING VISITATION HOURS ONLY (7 AM -8PM)   ?The support person(s) must pass our screening, gel in and out, and wear a mask at all times, including in the patient?s room. ?Patients must also wear a mask when staff or their support person are in the room. ?Visitors GUEST BADGE MUST BE WORN VISIBLY  ?One adult visitor may remain with you overnight and MUST be in the room by 8 P.M. ?  ? ? Your procedure is scheduled on: 05/02/21 ? ? Report to [facility] Main Entrance ? ?  Report to admitting at 7:00 AM ? ? Call this number if you have problems the morning of surgery [phone] ? ? Do not eat food :After Midnight. ? ? After Midnight you may have the following liquids until 6:45 AM DAY OF SURGERY ? ?Water ?Black Coffee (sugar ok, NO MILK/CREAM OR CREAMERS)  ?Tea (sugar ok, NO MILK/CREAM OR CREAMERS) regular and decaf                             ?Plain Jell-O (NO RED)                                           ?Fruit ices (not with fruit pulp, NO RED)                                     ?Popsicles (NO RED)                                                                  ?Juice: apple, WHITE grape, WHITE cranberry ?Sports drinks like Gatorade (NO RED) ?Clear broth(vegetable,chicken,beef) ? ?             ?The day of surgery:  ?Drink ONE (1) Pre-Surgery Clear Ensure at 6:45 AM the morning of surgery. Drink in one sitting. Do not sip.  ?This drink was given to you during your hospital  ?pre-op appointment visit. ?Nothing else to drink after completing the  ?Pre-Surgery Clear Ensure. ?  ?       If you have questions, please contact your surgeon?s office. ? ? ?FOLLOW BOWEL PREP AND ANY ADDITIONAL PRE OP INSTRUCTIONS YOU  RECEIVED FROM YOUR SURGEON'S OFFICE!!! ?  ?  ?Oral Hygiene is also important to reduce your risk of infection.                                    ?Remember - BRUSH YOUR TEETH THE MORNING OF SURGERY WITH YOUR REGULAR TOOTHPASTE ? ? Take these medicines the morning of surgery with A SIP OF WATER: Rosuvastatin.  ?                  ?  You may not have any metal on your body including jewelry, and body piercing ? ?           Do not wear lotions, powders, cologne, or deodorant ? ?            Men may shave face and neck. ? ? Do not bring valuables to the hospital. [place] [facility] NOT ?            RESPONSIBLE   FOR VALUABLES. ? ? Bring small overnight bag day of surgery. ? ?            Please read over the following fact sheets you were given: IF [place] [id number]- Apolonio Schneiders ? ?   [place] - Preparing for Surgery ?Before surgery, you can play an important role.  Because skin is not sterile, your skin needs to be as free of germs as possible.  You can reduce the number of germs on your skin by washing with CHG (chlorahexidine gluconate) soap before surgery.  CHG is an antiseptic cleaner which kills germs and bonds with the skin to continue killing germs even after washing. ?Please DO NOT use if you have an allergy to CHG or antibacterial soaps.  If your skin becomes reddened/irritated stop using the CHG and inform your nurse when you arrive at Short Stay. ?Do not shave (including legs and underarms) for at least 48 hours prior to the first CHG shower.  You may shave your face/neck. ? ?Please follow these instructions carefully: ? 1.  Shower with CHG Soap the night before surgery and the  morning of surgery. ? 2.  If you choose to wash your hair, wash your hair first as usual with your normal  shampoo. ? 3.  After you shampoo, rinse your hair and body thoroughly to remove the shampoo.                            ? 4.  Use CHG as you would any other liquid soap.   You can apply chg directly to the skin and wash.  Gently with a scrungie or clean washcloth. ? 5.  Apply the CHG Soap to your body ONLY FROM THE NECK DOWN.   Do   not use on face/ open      ?                     Wound or open sores. Avoid contact with eyes, ears mouth and   genitals (private parts).  ?                     Production manager,  Genitals (private parts) with your normal soap. ?            6.  Wash thoroughly, paying special attention to the area where your    surgery  will be performed. ? 7.  Thoroughly rinse your body with warm water from the neck down. ? 8.  DO NOT shower/wash with your normal soap after using and rinsing off the CHG Soap. ?               9.  Pat yourself dry with a clean towel. ?           10.  Wear clean pajamas. ?           11.  Place clean sheets on  your bed the night of your first shower and do not  sleep with pets. ?Day of Surgery : ?Do not apply any lotions/deodorants the morning of surgery.  Please wear clean clothes to the hospital/surgery center. ? ?FAILURE TO FOLLOW THESE INSTRUCTIONS MAY RESULT IN THE CANCELLATION OF YOUR SURGERY ? ?PATIENT SIGNATURE_________________________________ ? ?NURSE SIGNATURE__________________________________ ? ?________________________________________________________________________  ? ?Incentive Spirometer ? ?An incentive spirometer is a tool that can help keep your lungs clear and active. This tool measures how well you are filling your lungs with each breath. Taking long deep breaths may help reverse or decrease the chance of developing breathing (pulmonary) problems (especially infection) following: ?A long period of time when you are unable to move or be active. ?BEFORE THE PROCEDURE  ?If the spirometer includes an indicator to show your best effort, your nurse or respiratory therapist will set it to a desired goal. ?If possible, sit up straight or lean slightly forward. Try not to slouch. ?Hold the incentive spirometer in an upright  position. ?INSTRUCTIONS FOR USE  ?Sit on the edge of your bed if possible, or sit up as far as you can in bed or on a chair. ?Hold the incentive spirometer in an upright position. ?Breathe out normally. ?Place the mouthpiece in your mouth and seal your lips tightly around it. ?Breathe in slowly and as deeply as possible, raising the piston or the ball toward the top of the column. ?Hold your breath for 3-5 seconds or for as long as possible. Allow the piston or ball to fall to the bottom of the column. ?Remove the mouthpiece from your mouth and breathe out normally. ?Rest for a few seconds and repeat Steps 1 through 7 at least 10 times every 1-2 hours when you are awake. Take your time and take a few normal breaths between deep breaths. ?The spirometer may include an indicator to show your best effort. Use the indicator as a goal to work toward during each repetition. ?After each set of 10 deep breaths, practice coughing to be sure your lungs are clear. If you have an incision (the cut made at the time of surgery), support your incision when coughing by placing a pillow or rolled up towels firmly against it. ?Once you are able to get out of bed, walk around indoors and cough well. You may stop using the incentive spirometer when instructed by your caregiver.  ?RISKS AND COMPLICATIONS ?Take your time so you do not get dizzy or light-headed. ?If you are in pain, you may need to take or ask for pain medication before doing incentive spirometry. It is harder to take a deep breath if you are having pain. ?AFTER USE ?Rest and breathe slowly and easily. ?It can be helpful to keep track of a log of your progress. Your caregiver can provide you with a simple table to help with this. ?If you are using the spirometer at home, follow these instructions: ?SEEK MEDICAL CARE IF:  ?You are having difficultly using the spirometer. ?You have trouble using the spirometer as often as instructed. ?Your pain medication is not giving  enough relief while using the spirometer. ?You develop fever of 100.5? F (38.1? C) or higher. ?SEEK IMMEDIATE MEDICAL CARE IF:  ?You cough up bloody sputum that had not been present before. ?You develop fever of 102? F

## 2021-04-18 NOTE — Progress Notes (Addendum)
COVID Vaccine Completed: yes x1 ?Date COVID Vaccine completed: 08/20/19 ?Has received booster: ?COVID vaccine manufacturer: [employer]  ? ?Date of COVID positive in last 90 days: no ? ?PCP - Sueanne Margarita, DO ?Cardiologist - n/a ? ?Chest x-ray - n/a ?EKG - 04/21/21 Epic/chart ?Stress Test - n/a ?ECHO - n/a ?Cardiac Cath - n/a ?Pacemaker/ICD device last checked: n/a ?Spinal Cord Stimulator: n/a ? ?Bowel Prep - no ? ?Sleep Study - n/a ?CPAP -  ? ?Fasting Blood Sugar - pre DM, no checks at ?Checks Blood Sugar _____ times a day ? ?Blood Thinner Instructions: n/a ?Aspirin Instructions: ?Last Dose: ? ?Activity level: Can go up a flight of stairs and perform activities of daily living without stopping and without symptoms of chest pain or shortness of breath.   ? ?Anesthesia review:  ? ?Patient denies shortness of breath, fever, cough and chest pain at PAT appointment ? ? ?Patient verbalized understanding of instructions that were given to them at the PAT appointment. Patient was also instructed that they will need to review over the PAT instructions again at home before surgery.  ?

## 2021-04-21 ENCOUNTER — Encounter (HOSPITAL_COMMUNITY)
Admission: RE | Admit: 2021-04-21 | Discharge: 2021-04-21 | Disposition: A | Discharge status: Home / Self Care | Payer: 59 | Source: Ambulatory Visit | Attending: Orthopaedic Surgery | Admitting: Orthopaedic Surgery

## 2021-04-21 ENCOUNTER — Encounter (HOSPITAL_COMMUNITY): Payer: Self-pay

## 2021-04-21 VITALS — BP 146/81 | HR 76 | Temp 98.1°F | Ht 66.0 in | Wt 242.0 lb

## 2021-04-21 DIAGNOSIS — I10 Essential (primary) hypertension: Secondary | ICD-10-CM | POA: Diagnosis not present

## 2021-04-21 DIAGNOSIS — Z01818 Encounter for other preprocedural examination: Secondary | ICD-10-CM | POA: Diagnosis present

## 2021-04-21 DIAGNOSIS — R7303 Prediabetes: Secondary | ICD-10-CM | POA: Insufficient documentation

## 2021-04-21 HISTORY — DX: Malignant (primary) neoplasm, unspecified: C80.1

## 2021-04-21 LAB — BASIC METABOLIC PANEL
Anion gap: 6 (ref 5–15)
BUN: 25 mg/dL — ABNORMAL HIGH (ref 8–23)
CO2: 25 mmol/L (ref 22–32)
Calcium: 9 mg/dL (ref 8.9–10.3)
Chloride: 106 mmol/L (ref 98–111)
Creatinine, Ser: 0.95 mg/dL (ref 0.61–1.24)
GFR, Estimated: >60 mL/min (ref >60)
Glucose, Bld: 97 mg/dL (ref 70–99)
Potassium: 4.1 mmol/L (ref 3.5–5.1)
Sodium: 137 mmol/L (ref 135–145)

## 2021-04-21 LAB — GLUCOSE, CAPILLARY: Glucose-Capillary: 115 mg/dL — ABNORMAL HIGH (ref 70–99)

## 2021-04-21 LAB — TYPE AND SCREEN
ABO/RH(D): A POS
Antibody Screen: NEGATIVE

## 2021-04-21 LAB — CBC
HCT: 48.1 % (ref 39.0–52.0)
Hemoglobin: 16.1 g/dL (ref 13.0–17.0)
MCH: 30.6 pg (ref 26.0–34.0)
MCHC: 33.5 g/dL (ref 30.0–36.0)
MCV: 91.4 fL (ref 80.0–100.0)
Platelets: 229 10*3/uL (ref 150–400)
RBC: 5.26 MIL/uL (ref 4.22–5.81)
RDW: 13.2 % (ref 11.5–15.5)
WBC: 8.2 10*3/uL (ref 4.0–10.5)
nRBC: 0 % (ref 0.0–0.2)

## 2021-04-21 LAB — SURGICAL PCR SCREEN
MRSA, PCR: NEGATIVE
Staphylococcus aureus: POSITIVE — AB

## 2021-04-21 NOTE — Progress Notes (Signed)
[facility] +, results routed to Dr. Ninfa Linden ?

## 2021-04-22 LAB — HEMOGLOBIN A1C
Hgb A1c MFr Bld: 5.8 % — ABNORMAL HIGH (ref 4.8–5.6)
Mean Plasma Glucose: 120 mg/dL

## 2021-05-01 NOTE — H&P (Signed)
TOTAL HIP ADMISSION H&P ? ?Patient is admitted for left total hip arthroplasty. ? ?Subjective: ? ?Chief Complaint: left hip pain ? ?HPI: Brian Salazar, 66 y.o. male, has a history of pain and functional disability in the left hip(s) due to arthritis and patient has failed non-surgical conservative treatments for greater than 12 weeks to include NSAID's and/or analgesics, corticosteriod injections, flexibility and strengthening excercises, weight reduction as appropriate, and activity modification.  Onset of symptoms was gradual starting 4 years ago with gradually worsening course since that time.The patient noted no past surgery on the left hip(s).  Patient currently rates pain in the left hip at 10 out of 10 with activity. Patient has night pain, worsening of pain with activity and weight bearing, pain that interfers with activities of daily living, and pain with passive range of motion. Patient has evidence of subchondral cysts, subchondral sclerosis, periarticular osteophytes, and joint space narrowing by imaging studies. This condition presents safety issues increasing the risk of falls.  There is no current active infection. ? ?Patient Active Problem List  ? Diagnosis Date Noted  ? Unilateral primary osteoarthritis, left hip 02/19/2021  ? History of total right hip replacement 07/20/2017  ? Morbid obesity ([place]) 02/15/2015  ? Umbilical hernia without obstruction and without gangrene 06/22/2014  ? Degenerative arthritis of hip, right 09/16/2012  ? Essential hypertension, benign 08/30/2012  ? Hyperlipidemia 08/30/2012  ? Arthralgia of hip 08/30/2012  ? ?Past Medical History:  ?Diagnosis Date  ? Arthritis   ? OA RIGHT HIP  ? 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 GERD (gastroesophageal reflux disease)   ? occas  ? Hypertension   ? Shortness of breath   ? with exertion  ?  ?Past Surgical History:  ?Procedure Laterality Date  ? BIOPSY  07/28/2013  ? Procedure: BIOPSY;  Surgeon: Danie Binder, MD;  Location: [facility];   Service: Endoscopy;;  ? COLONOSCOPY    ? COLONOSCOPY N/A 07/28/2013  ? Procedure: COLONOSCOPY;  Surgeon: Danie Binder, MD;  Location: [facility];  Service: Endoscopy;  Laterality: N/A;  10:45 AM  ? HERNIA REPAIR    ? POLYPECTOMY  07/28/2013  ? Procedure: POLYPECTOMY;  Surgeon: Danie Binder, MD;  Location: [facility] SU[facility];  Service: Endoscopy;;  ? TOTAL HIP ARTHROPLASTY Right 09/16/2012  ? Procedure: RIGHT TOTAL HIP ARTHROPLASTY ANTERIOR APPROACH;  Surgeon: Mcarthur Rossetti, MD;  Location: [facility] ORS;  Service: Orthopedics;  Laterality: Right;  Right Total Hip Arthroplasty, Anterior Approach  ?  ?No current facility-administered medications for this encounter.  ? ?Current Outpatient Medications  ?Medication Sig Dispense Refill Last Dose  ? lisinopril-hydrochlorothiazide (ZESTORETIC) 20-12.5 MG tablet Take 1 tablet by mouth daily. 30 tablet 5   ? magnesium oxide (MAG-OX) 400 MG tablet Take 400 mg by mouth daily.     ? rosuvastatin (CRESTOR) 10 MG tablet Take 1 tablet (10 mg total) by mouth daily. 30 tablet 5   ? ?Allergies  ?Allergen Reactions  ? Simvastatin   ?  Made me feel funny  ?  ?Social History  ? ?Tobacco Use  ? Smoking status: Never  ? Smokeless tobacco: Never  ?Substance Use Topics  ? Alcohol use: Yes  ?  Comment: RARE ALCOHOL  ?  ?No family history on file.  ? ?Review of Systems  ?All other systems reviewed and are negative. ? ?Objective: ? ?Physical Exam ?Vitals reviewed.  ?Constitutional:   ?   Appearance: Normal appearance.  ?HENT:  ?  Head: Normocephalic and atraumatic.  ?Eyes:  ?   Extraocular Movements: Extraocular movements intact.  ?   Pupils: Pupils are equal, round, and reactive to light.  ?Cardiovascular:  ?   Rate and Rhythm: Normal rate and regular rhythm.  ?   Pulses: Normal pulses.  ?Pulmonary:  ?   Effort: Pulmonary effort is normal.  ?   Breath sounds: Normal breath sounds.  ?Abdominal:  ?   Palpations: Abdomen is soft.  ?Musculoskeletal:  ?   Cervical back: Normal range of motion  and neck supple.  ?   Left hip: Tenderness and bony tenderness present. Decreased range of motion. Decreased strength.  ?Neurological:  ?   Mental Status: He is alert and oriented to person, place, and time.  ?Psychiatric:     ?   Behavior: Behavior normal.  ? ? ?Vital signs in last 24 hours: ?  ? ?Labs: ? ? ?Estimated body mass index is 39.06 kg/m? as calculated from the following: ?  Height as of 04/21/21: '5\' 6"'$  (1.676 m). ?  Weight as of 04/21/21: 109.8 kg. ? ? ?Imaging Review ?Plain radiographs demonstrate severe degenerative joint disease of the left hip(s). The bone quality appears to be excellent for age and reported activity level. ? ? ? ? ? ?Assessment/Plan: ? ?End stage arthritis, left hip(s) ? ?The patient history, physical examination, clinical judgement of the provider and imaging studies are consistent with end stage degenerative joint disease of the left hip(s) and total hip arthroplasty is deemed medically necessary. The treatment options including medical management, injection therapy, arthroscopy and arthroplasty were discussed at length. The risks and benefits of total hip arthroplasty were presented and reviewed. The risks due to aseptic loosening, infection, stiffness, dislocation/subluxation,  thromboembolic complications and other imponderables were discussed.  The patient acknowledged the explanation, agreed to proceed with the plan and consent was signed. Patient is being admitted for inpatient treatment for surgery, pain control, PT, OT, prophylactic antibiotics, VTE prophylaxis, progressive ambulation and ADL's and discharge planning.The patient is planning to be discharged home with home health services ? ? ? ?

## 2021-05-01 NOTE — Anesthesia Preprocedure Evaluation (Addendum)
Anesthesia Evaluation  ?Patient identified by MRN, date of birth, ID band ?Patient awake ? ? ? ?Reviewed: ?Allergy & Precautions, NPO status , Patient's Chart, lab work & pertinent test results ? ?History of Anesthesia Complications ?Negative for: history of anesthetic complications ? ?Airway ?Mallampati: III ? ?TM Distance: >3 FB ?Neck ROM: Full ? ? ? Dental ?no notable dental hx. ?(+) Dental Advisory Given ?  ?Pulmonary ?neg pulmonary ROS,  ?  ?Pulmonary exam normal ? ? ? ? ? ? ? Cardiovascular ?hypertension, Pt. on medications ?Normal cardiovascular exam ? ? ?  ?Neuro/Psych ?negative neurological ROS ?   ? GI/Hepatic ?Neg liver ROS, GERD  Controlled,  ?Endo/Other  ?negative endocrine ROSobesity ? Renal/GU ?negative Renal ROS  ? ?  ?Musculoskeletal ? ?(+) Arthritis ,  ? Abdominal ?  ?Peds ? Hematology ?negative hematology ROS ?(+)   ?Anesthesia Other Findings ? ? Reproductive/Obstetrics ? ?  ? ? ? ? ? ? ? ? ? ? ? ? ? ?  ?  ? ? ? ? ? ? ? ?Anesthesia Physical ?Anesthesia Plan ? ?ASA: 2 ? ?Anesthesia Plan: Spinal  ? ?Post-op Pain Management: Celebrex PO (pre-op)* and Tylenol PO (pre-op)*  ? ?Induction:  ? ?PONV Risk Score and Plan: 2 and Ondansetron and Propofol infusion ? ?Airway Management Planned: Simple Face Mask ? ?Additional Equipment:  ? ?Intra-op Plan:  ? ?Post-operative Plan:  ? ?Informed Consent: I have reviewed the patients History and Physical, chart, labs and discussed the procedure including the risks, benefits and alternatives for the proposed anesthesia with the patient or authorized representative who has indicated his/her understanding and acceptance.  ? ? ? ?Dental advisory given ? ?Plan Discussed with: Anesthesiologist and CRNA ? ?Anesthesia Plan Comments:   ? ? ? ? ? ?Anesthesia Quick Evaluation ? ?

## 2021-05-02 ENCOUNTER — Observation Stay (HOSPITAL_COMMUNITY)
Admission: RE | Admit: 2021-05-02 | Discharge: 2021-05-04 | Disposition: A | Discharge status: Home Health | Payer: No Typology Code available for payment source | Attending: Orthopaedic Surgery | Admitting: Orthopaedic Surgery

## 2021-05-02 ENCOUNTER — Observation Stay (HOSPITAL_COMMUNITY): Payer: No Typology Code available for payment source

## 2021-05-02 ENCOUNTER — Ambulatory Visit (HOSPITAL_COMMUNITY): Payer: No Typology Code available for payment source | Admitting: Anesthesiology

## 2021-05-02 ENCOUNTER — Ambulatory Visit (HOSPITAL_COMMUNITY): Payer: No Typology Code available for payment source

## 2021-05-02 ENCOUNTER — Ambulatory Visit (HOSPITAL_BASED_OUTPATIENT_CLINIC_OR_DEPARTMENT_OTHER): Payer: No Typology Code available for payment source | Admitting: Anesthesiology

## 2021-05-02 ENCOUNTER — Other Ambulatory Visit: Payer: Self-pay

## 2021-05-02 ENCOUNTER — Encounter (HOSPITAL_COMMUNITY)
Admission: RE | Disposition: A | Discharge status: Home Health | Payer: Self-pay | Source: Home / Self Care | Attending: Orthopaedic Surgery

## 2021-05-02 ENCOUNTER — Encounter (HOSPITAL_COMMUNITY): Payer: Self-pay | Admitting: Orthopaedic Surgery

## 2021-05-02 DIAGNOSIS — I10 Essential (primary) hypertension: Secondary | ICD-10-CM | POA: Insufficient documentation

## 2021-05-02 DIAGNOSIS — M1612 Unilateral primary osteoarthritis, left hip: Secondary | ICD-10-CM | POA: Diagnosis not present

## 2021-05-02 DIAGNOSIS — Z85828 Personal history of other malignant neoplasm of skin: Secondary | ICD-10-CM | POA: Diagnosis not present

## 2021-05-02 DIAGNOSIS — Z96641 Presence of right artificial hip joint: Secondary | ICD-10-CM | POA: Insufficient documentation

## 2021-05-02 DIAGNOSIS — Z79899 Other long term (current) drug therapy: Secondary | ICD-10-CM | POA: Diagnosis not present

## 2021-05-02 DIAGNOSIS — Z96642 Presence of left artificial hip joint: Secondary | ICD-10-CM

## 2021-05-02 HISTORY — PX: TOTAL HIP ARTHROPLASTY: SHX124

## 2021-05-02 SURGERY — ARTHROPLASTY, HIP, TOTAL, ANTERIOR APPROACH
Anesthesia: Spinal | Site: Hip | Laterality: Left

## 2021-05-02 MED ORDER — ORAL CARE MOUTH RINSE: 15.0000 mL | Freq: Once | OROMUCOSAL | Status: AC

## 2021-05-02 MED ORDER — ONDANSETRON HCL 4 MG/2ML IJ SOLN: 4.0000 mg | Freq: Four times a day (QID) | INTRAMUSCULAR | Status: DC | PRN

## 2021-05-02 MED ORDER — ONDANSETRON HCL 4 MG/2ML IJ SOLN
INTRAMUSCULAR | Status: DC | PRN
  Administered 2021-05-02: 4 mg via INTRAVENOUS

## 2021-05-02 MED ORDER — LACTATED RINGERS IV BOLUS
500.0000 mL | Freq: Once | INTRAVENOUS | Status: AC
  Administered 2021-05-02: 500 mL via INTRAVENOUS

## 2021-05-02 MED ORDER — PROPOFOL 500 MG/50ML IV EMUL
INTRAVENOUS | Status: DC | PRN
  Administered 2021-05-02: 50 ug/kg/min via INTRAVENOUS

## 2021-05-02 MED ORDER — FENTANYL CITRATE (PF) 100 MCG/2ML IJ SOLN
INTRAMUSCULAR | Status: AC
  Filled 2021-05-02: qty 2

## 2021-05-02 MED ORDER — BUPIVACAINE IN DEXTROSE 0.75-8.25 % IT SOLN
INTRATHECAL | Status: DC | PRN
  Administered 2021-05-02: 1.8 mL via INTRATHECAL

## 2021-05-02 MED ORDER — ASPIRIN 81 MG PO CHEW
81.0000 mg | CHEWABLE_TABLET | Freq: Two times a day (BID) | ORAL | Status: DC
  Administered 2021-05-02 – 2021-05-04 (×4): 81 mg via ORAL
  Filled 2021-05-02 (×4): qty 1

## 2021-05-02 MED ORDER — METHOCARBAMOL 500 MG IVPB - SIMPLE MED
500.0000 mg | Freq: Four times a day (QID) | INTRAVENOUS | Status: DC | PRN
  Administered 2021-05-02: 500 mg via INTRAVENOUS
  Filled 2021-05-02: qty 50

## 2021-05-02 MED ORDER — ACETAMINOPHEN 500 MG PO TABS
1000.0000 mg | ORAL_TABLET | Freq: Once | ORAL | Status: AC
  Administered 2021-05-02: 1000 mg via ORAL
  Filled 2021-05-02: qty 2

## 2021-05-02 MED ORDER — ROSUVASTATIN CALCIUM 10 MG PO TABS
10.0000 mg | ORAL_TABLET | Freq: Every day | ORAL | Status: DC
  Administered 2021-05-03 – 2021-05-04 (×2): 10 mg via ORAL
  Filled 2021-05-02 (×2): qty 1

## 2021-05-02 MED ORDER — HYDROMORPHONE HCL 2 MG PO TABS: 2.0000 mg | ORAL_TABLET | ORAL | Status: DC | PRN

## 2021-05-02 MED ORDER — SODIUM CHLORIDE 0.9 % IR SOLN
Status: DC | PRN
Start: 2021-05-02 — End: 2021-05-02
  Administered 2021-05-02: 1000 mL

## 2021-05-02 MED ORDER — FENTANYL CITRATE (PF) 100 MCG/2ML IJ SOLN
INTRAMUSCULAR | Status: DC | PRN
  Administered 2021-05-02: 100 ug via INTRAVENOUS

## 2021-05-02 MED ORDER — DOCUSATE SODIUM 100 MG PO CAPS
100.0000 mg | ORAL_CAPSULE | Freq: Two times a day (BID) | ORAL | Status: DC
  Administered 2021-05-02 – 2021-05-04 (×4): 100 mg via ORAL
  Filled 2021-05-02 (×4): qty 1

## 2021-05-02 MED ORDER — ONDANSETRON HCL 4 MG PO TABS: 4.0000 mg | ORAL_TABLET | Freq: Four times a day (QID) | ORAL | Status: DC | PRN

## 2021-05-02 MED ORDER — MAGNESIUM OXIDE 400 MG PO TABS
400.0000 mg | ORAL_TABLET | Freq: Every day | ORAL | Status: DC
  Filled 2021-05-02: qty 1

## 2021-05-02 MED ORDER — POVIDONE-IODINE 10 % EX SWAB
2.0000 "application " | Freq: Once | CUTANEOUS | Status: AC
  Administered 2021-05-02: 2 via TOPICAL

## 2021-05-02 MED ORDER — LISINOPRIL 20 MG PO TABS
20.0000 mg | ORAL_TABLET | Freq: Every day | ORAL | Status: DC
  Administered 2021-05-03 – 2021-05-04 (×2): 20 mg via ORAL
  Filled 2021-05-02 (×2): qty 1

## 2021-05-02 MED ORDER — 0.9 % SODIUM CHLORIDE (POUR BTL) OPTIME
TOPICAL | Status: DC | PRN
  Administered 2021-05-02: 1000 mL

## 2021-05-02 MED ORDER — METOCLOPRAMIDE HCL 5 MG PO TABS: 5.0000 mg | ORAL_TABLET | Freq: Three times a day (TID) | ORAL | Status: DC | PRN

## 2021-05-02 MED ORDER — FENTANYL CITRATE PF 50 MCG/ML IJ SOSY
PREFILLED_SYRINGE | INTRAMUSCULAR | Status: AC
  Administered 2021-05-02: 50 ug
  Filled 2021-05-02: qty 3

## 2021-05-02 MED ORDER — SODIUM CHLORIDE 0.9 % IV SOLN: INTRAVENOUS | Status: DC

## 2021-05-02 MED ORDER — METHOCARBAMOL 500 MG PO TABS
500.0000 mg | ORAL_TABLET | Freq: Four times a day (QID) | ORAL | Status: DC | PRN
  Administered 2021-05-02 – 2021-05-03 (×3): 500 mg via ORAL
  Filled 2021-05-02 (×4): qty 1

## 2021-05-02 MED ORDER — METOCLOPRAMIDE HCL 5 MG/ML IJ SOLN: 5.0000 mg | Freq: Three times a day (TID) | INTRAMUSCULAR | Status: DC | PRN

## 2021-05-02 MED ORDER — OXYCODONE HCL 5 MG PO TABS
10.0000 mg | ORAL_TABLET | ORAL | Status: DC | PRN
  Administered 2021-05-02 – 2021-05-03 (×2): 10 mg via ORAL
  Filled 2021-05-02: qty 2
  Filled 2021-05-02: qty 3

## 2021-05-02 MED ORDER — CEFAZOLIN SODIUM-DEXTROSE 1-4 GM/50ML-% IV SOLN
1.0000 g | Freq: Four times a day (QID) | INTRAVENOUS | Status: AC
  Administered 2021-05-02 (×2): 1 g via INTRAVENOUS
  Filled 2021-05-02 (×2): qty 50

## 2021-05-02 MED ORDER — PANTOPRAZOLE SODIUM 40 MG PO TBEC
40.0000 mg | DELAYED_RELEASE_TABLET | Freq: Every day | ORAL | Status: DC
  Administered 2021-05-02 – 2021-05-04 (×3): 40 mg via ORAL
  Filled 2021-05-02 (×3): qty 1

## 2021-05-02 MED ORDER — MIDAZOLAM HCL 2 MG/2ML IJ SOLN
INTRAMUSCULAR | Status: AC
  Filled 2021-05-02: qty 2

## 2021-05-02 MED ORDER — CEFAZOLIN SODIUM-DEXTROSE 2-4 GM/100ML-% IV SOLN
2.0000 g | INTRAVENOUS | Status: AC
  Administered 2021-05-02: 2 g via INTRAVENOUS
  Filled 2021-05-02: qty 100

## 2021-05-02 MED ORDER — LISINOPRIL-HYDROCHLOROTHIAZIDE 20-12.5 MG PO TABS: 1.0000 | ORAL_TABLET | Freq: Every day | ORAL | Status: DC

## 2021-05-02 MED ORDER — HYDROMORPHONE HCL 2 MG PO TABS: 1.0000 mg | ORAL_TABLET | ORAL | Status: DC | PRN

## 2021-05-02 MED ORDER — CELECOXIB 200 MG PO CAPS
200.0000 mg | ORAL_CAPSULE | Freq: Once | ORAL | Status: AC
  Administered 2021-05-02: 200 mg via ORAL
  Filled 2021-05-02: qty 1

## 2021-05-02 MED ORDER — DEXAMETHASONE SODIUM PHOSPHATE 10 MG/ML IJ SOLN
INTRAMUSCULAR | Status: DC | PRN
  Administered 2021-05-02: 10 mg via INTRAVENOUS

## 2021-05-02 MED ORDER — AMISULPRIDE (ANTIEMETIC) 5 MG/2ML IV SOLN: 10.0000 mg | Freq: Once | INTRAVENOUS | Status: DC | PRN

## 2021-05-02 MED ORDER — DIPHENHYDRAMINE HCL 12.5 MG/5ML PO ELIX: 12.5000 mg | ORAL_SOLUTION | ORAL | Status: DC | PRN

## 2021-05-02 MED ORDER — MENTHOL 3 MG MT LOZG: 1.0000 | LOZENGE | OROMUCOSAL | Status: DC | PRN

## 2021-05-02 MED ORDER — CHLORHEXIDINE GLUCONATE 0.12 % MT SOLN
15.0000 mL | Freq: Once | OROMUCOSAL | Status: AC
  Administered 2021-05-02: 15 mL via OROMUCOSAL

## 2021-05-02 MED ORDER — MIDAZOLAM HCL 5 MG/5ML IJ SOLN
INTRAMUSCULAR | Status: DC | PRN
  Administered 2021-05-02: 2 mg via INTRAVENOUS

## 2021-05-02 MED ORDER — HYDROMORPHONE HCL 1 MG/ML IJ SOLN: 0.5000 mg | INTRAMUSCULAR | Status: DC | PRN

## 2021-05-02 MED ORDER — PHENYLEPHRINE HCL (PRESSORS) 10 MG/ML IV SOLN
INTRAVENOUS | Status: AC
  Filled 2021-05-02: qty 1

## 2021-05-02 MED ORDER — METHOCARBAMOL 500 MG IVPB - SIMPLE MED
INTRAVENOUS | Status: AC
  Filled 2021-05-02: qty 50

## 2021-05-02 MED ORDER — ACETAMINOPHEN 325 MG PO TABS
325.0000 mg | ORAL_TABLET | Freq: Four times a day (QID) | ORAL | Status: DC | PRN
  Administered 2021-05-04: 650 mg via ORAL
  Filled 2021-05-02 (×2): qty 2

## 2021-05-02 MED ORDER — DEXAMETHASONE SODIUM PHOSPHATE 10 MG/ML IJ SOLN
INTRAMUSCULAR | Status: AC
  Filled 2021-05-02: qty 1

## 2021-05-02 MED ORDER — PHENOL 1.4 % MT LIQD: 1.0000 | OROMUCOSAL | Status: DC | PRN

## 2021-05-02 MED ORDER — ALUM & MAG HYDROXIDE-SIMETH 200-200-20 MG/5ML PO SUSP: 30.0000 mL | ORAL | Status: DC | PRN

## 2021-05-02 MED ORDER — HYDROCHLOROTHIAZIDE 12.5 MG PO TABS
12.5000 mg | ORAL_TABLET | Freq: Every day | ORAL | Status: DC
  Administered 2021-05-03 – 2021-05-04 (×2): 12.5 mg via ORAL
  Filled 2021-05-02 (×2): qty 1

## 2021-05-02 MED ORDER — PROPOFOL 1000 MG/100ML IV EMUL
INTRAVENOUS | Status: AC
  Filled 2021-05-02: qty 100

## 2021-05-02 MED ORDER — PROPOFOL 10 MG/ML IV BOLUS
INTRAVENOUS | Status: DC | PRN
  Administered 2021-05-02: 20 mg via INTRAVENOUS
  Administered 2021-05-02: 30 mg via INTRAVENOUS

## 2021-05-02 MED ORDER — ONDANSETRON HCL 4 MG/2ML IJ SOLN
INTRAMUSCULAR | Status: AC
  Filled 2021-05-02: qty 2

## 2021-05-02 MED ORDER — OXYCODONE HCL 5 MG PO TABS
5.0000 mg | ORAL_TABLET | ORAL | Status: DC | PRN
  Administered 2021-05-02 – 2021-05-03 (×3): 10 mg via ORAL
  Administered 2021-05-04: 5 mg via ORAL
  Filled 2021-05-02 (×4): qty 2

## 2021-05-02 MED ORDER — PHENYLEPHRINE 40 MCG/ML (10ML) SYRINGE FOR IV PUSH (FOR BLOOD PRESSURE SUPPORT)
PREFILLED_SYRINGE | INTRAVENOUS | Status: DC | PRN
  Administered 2021-05-02 (×3): 80 ug via INTRAVENOUS

## 2021-05-02 MED ORDER — TRANEXAMIC ACID-NACL 1000-0.7 MG/100ML-% IV SOLN
1000.0000 mg | INTRAVENOUS | Status: AC
  Administered 2021-05-02: 1000 mg via INTRAVENOUS
  Filled 2021-05-02: qty 100

## 2021-05-02 MED ORDER — FENTANYL CITRATE PF 50 MCG/ML IJ SOSY
25.0000 ug | PREFILLED_SYRINGE | INTRAMUSCULAR | Status: DC | PRN
  Administered 2021-05-02 (×3): 50 ug via INTRAVENOUS

## 2021-05-02 MED ORDER — LACTATED RINGERS IV SOLN
INTRAVENOUS | Status: DC
Start: 2021-05-02 — End: 2021-05-02

## 2021-05-02 SURGICAL SUPPLY — 48 items
ACETAB CUP W/GRIPTION 54 (Plate) ×2 IMPLANT
APL SKNCLS STERI-STRIP NONHPOA (GAUZE/BANDAGES/DRESSINGS)
BAG COUNTER SPONGE SURGICOUNT (BAG) ×2 IMPLANT
BAG SPEC THK2 15X12 ZIP CLS (MISCELLANEOUS)
BAG SPNG CNTER NS LX DISP (BAG) ×1
BAG ZIPLOCK 12X15 (MISCELLANEOUS) IMPLANT
BENZOIN TINCTURE PRP APPL 2/3 (GAUZE/BANDAGES/DRESSINGS) IMPLANT
BLADE SAW SGTL 18X1.27X75 (BLADE) ×2 IMPLANT
CATH COUDE 5CC RIBBED (CATHETERS) IMPLANT
CATH RIBBED COUDE 5CC (CATHETERS) ×2
COVER PERINEAL POST (MISCELLANEOUS) ×2 IMPLANT
COVER SURGICAL LIGHT HANDLE (MISCELLANEOUS) ×2 IMPLANT
CUP ACETAB W/GRIPTION 54 (Plate) IMPLANT
DRAPE FOOT SWITCH (DRAPES) ×2 IMPLANT
DRAPE STERI IOBAN 125X83 (DRAPES) ×2 IMPLANT
DRAPE U-SHAPE 47X51 STRL (DRAPES) ×4 IMPLANT
DRSG AQUACEL AG ADV 3.5X10 (GAUZE/BANDAGES/DRESSINGS) ×2 IMPLANT
DURAPREP 26ML APPLICATOR (WOUND CARE) ×2 IMPLANT
ELECT REM PT RETURN 15FT ADLT (MISCELLANEOUS) ×2 IMPLANT
GAUZE XEROFORM 1X8 LF (GAUZE/BANDAGES/DRESSINGS) ×2 IMPLANT
GLOVE BIO SURGEON STRL SZ7.5 (GLOVE) ×2 IMPLANT
GLOVE BIOGEL PI IND STRL 8 (GLOVE) ×2 IMPLANT
GLOVE BIOGEL PI INDICATOR 8 (GLOVE) ×2
GLOVE SKINSENSE NS SZ8.0 LF (GLOVE) ×1
GLOVE SKINSENSE STRL SZ8.0 LF (GLOVE) ×1 IMPLANT
GOWN STRL REUS W/ TWL XL LVL3 (GOWN DISPOSABLE) ×2 IMPLANT
GOWN STRL REUS W/TWL XL LVL3 (GOWN DISPOSABLE) ×4
HANDPIECE INTERPULSE COAX TIP (DISPOSABLE) ×2
HEAD M SROM 36MM 2 (Hips) IMPLANT
HOLDER FOLEY CATH W/STRAP (MISCELLANEOUS) ×2 IMPLANT
KIT TURNOVER KIT A (KITS) IMPLANT
LINER NEUTRAL 54X36MM PLUS 4 (Hips) ×1 IMPLANT
PACK ANTERIOR HIP CUSTOM (KITS) ×2 IMPLANT
SET HNDPC FAN SPRY TIP SCT (DISPOSABLE) ×1 IMPLANT
SPONGE T-LAP 18X18 ~~LOC~~+RFID (SPONGE) ×6 IMPLANT
SROM M HEAD 36MM 2 (Hips) ×2 IMPLANT
STAPLER VISISTAT 35W (STAPLE) IMPLANT
STEM CORAIL KLA10 (Stem) ×1 IMPLANT
STRIP CLOSURE SKIN 1/2X4 (GAUZE/BANDAGES/DRESSINGS) IMPLANT
SUT ETHIBOND NAB CT1 #1 30IN (SUTURE) ×2 IMPLANT
SUT ETHILON 2 0 PS N (SUTURE) IMPLANT
SUT MNCRL AB 4-0 PS2 18 (SUTURE) IMPLANT
SUT VIC AB 0 CT1 36 (SUTURE) ×2 IMPLANT
SUT VIC AB 1 CT1 36 (SUTURE) ×2 IMPLANT
SUT VIC AB 2-0 CT1 27 (SUTURE) ×4
SUT VIC AB 2-0 CT1 TAPERPNT 27 (SUTURE) ×2 IMPLANT
TRAY FOLEY MTR SLVR 16FR STAT (SET/KITS/TRAYS/PACK) IMPLANT
YANKAUER SUCT BULB TIP NO VENT (SUCTIONS) ×2 IMPLANT

## 2021-05-02 NOTE — Plan of Care (Signed)
?  Problem: Education: Goal: Knowledge of General Education information will improve Description: Including pain rating scale, medication(s)/side effects and non-pharmacologic comfort measures Outcome: Progressing   Problem: Pain Managment: Goal: General experience of comfort will improve Outcome: Progressing   Problem: Activity: Goal: Ability to avoid complications of mobility impairment will improve Outcome: Progressing   Problem: Clinical Measurements: Goal: Postoperative complications will be avoided or minimized Outcome: Progressing   Problem: Pain Management: Goal: Pain level will decrease with appropriate interventions Outcome: Progressing   

## 2021-05-02 NOTE — Anesthesia Procedure Notes (Signed)
Date/Time: 05/02/2021 10:07 AM ?Performed by: Sharlette Dense, CRNA ?Oxygen Delivery Method: Simple face mask ? ? ? ? ?

## 2021-05-02 NOTE — Anesthesia Postprocedure Evaluation (Signed)
Anesthesia Post Note ? ?Patient: Brian Salazar ? ?Procedure(s) Performed: LEFT TOTAL HIP ARTHROPLASTY ANTERIOR APPROACH (Left: Hip) ? ?  ? ?Patient location during evaluation: SICU ?Anesthesia Type: Spinal ?Level of consciousness: sedated ?Pain management: pain level controlled ?Vital Signs Assessment: post-procedure vital signs reviewed and stable ?Respiratory status: patient remains intubated per anesthesia plan ?Cardiovascular status: stable ?Postop Assessment: no apparent nausea or vomiting ?Anesthetic complications: no ? ? ?No notable events documented. ? ?Last Vitals:  ?Vitals:  ? 05/02/21 1230 05/02/21 1245  ?BP: 120/84 124/87  ?Pulse: 64 68  ?Resp: 14 18  ?Temp:    ?SpO2: 99% 100%  ?  ?Last Pain:  ?Vitals:  ? 05/02/21 1215  ?TempSrc:   ?PainSc: 5   ? ? ?  ?  ?  ?  ?  ?  ? ?Shadrick Senne DANIEL ? ? ? ? ?

## 2021-05-02 NOTE — Brief Op Note (Signed)
05/02/2021 ? ?10:48 AM ? ?PATIENT:  Brian Salazar  66 y.o. male ? ?PRE-OPERATIVE DIAGNOSIS:  OSTEOARTHRITIS / DEGENERATIVE JOINT DIEASE LEFT HIP ? ?POST-OPERATIVE DIAGNOSIS:  OSTEOARTHRITIS / DEGENERATIVE JOINT DIEASE LEFT HIP ? ?PROCEDURE:  Procedure(s): ?LEFT TOTAL HIP ARTHROPLASTY ANTERIOR APPROACH (Left) ? ?SURGEON:  Surgeon(s) and Role: ?   Mcarthur Rossetti, MD - Primary ? ?PHYSICIAN ASSISTANT:  Benita Stabile, PA-C ? ?ANESTHESIA:   spinal ? ?EBL:  200 mL  ? ?COUNTS:  YES ? ?DICTATION: .Other Dictation: Dictation Number [id number] ? ?PLAN OF CARE: Admit for overnight observation ? ?PATIENT DISPOSITION:  PACU - hemodynamically stable. ?  ?Delay start of Pharmacological VTE agent (>24hrs) due to surgical blood loss or risk of bleeding: no ? ?

## 2021-05-02 NOTE — Transfer of Care (Signed)
Immediate Anesthesia Transfer of Care Note ? ?Patient: Brian Salazar ? ?Procedure(s) Performed: LEFT TOTAL HIP ARTHROPLASTY ANTERIOR APPROACH (Left: Hip) ? ?Patient Location: PACU ? ?Anesthesia Type:Spinal ? ?Level of Consciousness: awake ? ?Airway & Oxygen Therapy: Patient Spontanous Breathing and Patient connected to face mask oxygen ? ?Post-op Assessment: Report given to RN and Post -op Vital signs reviewed and stable ? ?Post vital signs: Reviewed and stable ? ?Last Vitals:  ?Vitals Value Taken Time  ?BP 109/67 05/02/21 1113  ?Temp    ?Pulse 76 05/02/21 1114  ?Resp 17 05/02/21 1114  ?SpO2 100 % 05/02/21 1114  ?Vitals shown include unvalidated device data. ? ?Last Pain:  ?Vitals:  ? 05/02/21 0740  ?TempSrc: Oral  ?   ? ?  ? ?Complications: No notable events documented. ?

## 2021-05-02 NOTE — Interval H&P Note (Signed)
History and Physical Interval Note: The patient understands that he is here today for a left hip replacement to treat his left hip osteoarthritis.  There has been no acute or interval change in his medical status.  Please see H&P.  The risks and benefits of surgery been discussed in detail and informed consent is obtained.  The left operative hip has been marked. ? ?05/02/2021 ?8:15 AM ? ?Brian Salazar  has presented today for surgery, with the diagnosis of OSTEOARTHRITIS / [place].  The various methods of treatment have been discussed with the patient and family. After consideration of risks, benefits and other options for treatment, the patient has consented to  Procedure(s): ?LEFT TOTAL HIP ARTHROPLASTY ANTERIOR APPROACH (Left) as a surgical intervention.  The patient's history has been reviewed, patient examined, no change in status, stable for surgery.  I have reviewed the patient's chart and labs.  Questions were answered to the patient's satisfaction.   ? ? ?Mcarthur Rossetti ? ? ?

## 2021-05-02 NOTE — Anesthesia Procedure Notes (Addendum)
Spinal ? ?Patient location during procedure: OR ?Start time: 05/02/2021 9:38 AM ?End time: 05/02/2021 9:48 AM ?Reason for block: surgical anesthesia ?Staffing ?Performed: anesthesiologist  ?Anesthesiologist: Duane Boston, MD ?Preanesthetic Checklist ?Completed: patient identified, IV checked, risks and benefits discussed, surgical consent, monitors and equipment checked, pre-op evaluation and timeout performed ?Spinal Block ?Patient position: sitting ?Prep: DuraPrep ?Patient monitoring: cardiac monitor, continuous pulse ox and blood pressure ?Approach: midline ?Location: L2-3 ?Injection technique: single-shot ?Needle ?Needle type: Pencan  ?Needle gauge: 24 G ?Needle length: 9 cm ?Assessment ?Events: CSF return and second provider ?Additional Notes ?Functioning IV was confirmed and monitors were applied. Sterile prep and drape, including hand hygiene and sterile gloves were used. The patient was positioned and the spine was prepped. The skin was anesthetized with lidocaine.  Free flow of clear CSF was obtained prior to injecting local anesthetic into the CSF.  The spinal needle aspirated freely following injection.  The needle was carefully withdrawn.  The patient tolerated the procedure well.  ? ? ? ?

## 2021-05-02 NOTE — Op Note (Signed)
NAME: Brian Salazar, Brian Salazar ?MEDICAL RECORD NO: [id number] ?ACCOUNT NO: [id number] ?DATE OF BIRTH: July 09, 1955 ?FACILITY: [facility] ?LOCATION: [facility] ?PHYSICIAN: Lind Guest. Ninfa Linden, MD ? ?Operative Report  ? ?DATE OF PROCEDURE: 05/02/2021 ? ?PREOPERATIVE DIAGNOSIS:  Primary arthritis and degenerative joint disease, left hip. ? ?POSTOPERATIVE DIAGNOSIS:  Primary arthritis and degenerative joint disease, left hip. ? ?PROCEDURE:  Left total hip arthroplasty through direct anterior approach. ? ?IMPLANTS:  DePuy sector Gription acetabular component size 54, size 36+4 neutral polyethylene liner, size 10 Corail femoral component with varus offset, size 36-2 metal hip ball. ? ?SURGEON: Lind Guest. Ninfa Linden, MD ? ?ASSISTANT:  Erskine Emery, PA-C ? ?ANESTHESIA:  Spinal. ? ?BLOOD LOSS:  200 mL ? ?ANTIBIOTICS:  2 g IV Ancef. ? ?COMPLICATIONS:  None. ? ?INDICATIONS:  The patient is a very pleasant 66 year old gentleman well known to me.  He has debilitating arthritis involving his left hip.  I actually replaced his right hip back in 2014 and that has done well.  His right hip pain has been getting worse ? for the last few years and his x-ray showed severe arthritis of the left hip. At this point, he is ready to proceed with total hip arthroplasty.  He has daily hip pain and it is detrimentally affecting his mobility, his quality of life and his  ?activities of daily living.  Having had this done before, he is fully aware of the risk of acute blood loss anemia, nerve or vessel injury, fracture, infection, dislocation, DVT, implant failure, leg length differences, and skin and soft tissues issues.  ? He understands our goals are to decrease pain, improve mobility and overall improve quality of life. ? ?DESCRIPTION OF PROCEDURE:  After informed consent was obtained, appropriate left hip was marked. He was brought to the operating room and sat up on the stretcher where spinal anesthesia was obtained.  He was laid in supine position on the  stretcher.   ?Foley catheter was placed and traction boots were placed on both his feet.  Next, he was placed supine on the Hana fracture table, the perineal post in place and both legs in line skeletal traction device and no traction applied.  His left operative hip  ?was prepped and draped in DuraPrep and sterile drapes.  A timeout was called and he was identified as correct patient, correct left hip.  I then made an incision just inferior and posterior to the anterior superior iliac spine and carried this obliquely  ?down the leg.  We dissected down tensor fascia lata muscle.  Tensor fascia was then divided longitudinally to proceed with direct anterior approach to the hip.  I identified and cauterized circumflex vessels.  I then identified the hip capsule, opened up ? the hip capsule in L-type format, finding moderate joint effusion.  There were significant periarticular osteophytes around the lateral femoral head and neck.  We placed curved retractors around the medial and lateral femoral neck and made our femoral  ?neck cut with oscillating saw just proximal to the lesser trochanter and completed this with an osteotome. We placed a corkscrew guide in the femoral head and removed the femoral head in its entirety and found a wide area devoid of cartilage.  I then  ?placed a bent Hohmann over the medial acetabular rim and removed remnants of acetabular labrum and other debris.  I then began reaming using a size 43 reamer in a stepwise increments going up to a size 53 reamer with all reamers placed under direct  ?visualization. The  last reamer was also placed under direct fluoroscopy, so we could obtain our depth of reaming, our inclination and anteversion.  I then placed real DePuy sector Gription acetabular component size 54 and a 36+4 neutral polyethylene  ?liner.  Attention was then turned to the femur.  With the leg externally rotated to 120 degrees and extended and adducted, we were able to place a Mueller  retractor medially and Hohman retractor behind the greater trochanter.  I released lateral joint  ?capsule and used a box cutting osteotome to enter the femoral canal and a rongeur to lateralize it.  I then began broaching using the Corail broaching system from a size 8 going up to a size 10.  With a size 10 in place, we trialed a varus offset femoral ? neck just like we did on his other side and a 36-2 hip ball, reduced this in acetabulum.  We were pleased with leg length, offset, range of motion and stability assessed mechanically and radiographically.  We then dislocated the hip and removed the  ?trial components.  We placed the real Corail femoral component size 10 with varus offset and the real 36-2 hip ball and again reduced this in acetabulum and we were pleased with the stability, range of motion assessment radiographically and mechanically. ?  We then irrigated the hip with normal saline solution using pulsatile lavage.  We closed the joint capsule with interrupted #1 Ethibond suture followed by #1 Vicryl to close the tensor fascia.  0 Vicryl was used to close deep tissue and 2-0 Vicryl was  ?used to close the subcutaneous tissue.  The skin was closed with staples.  Aquacel dressing was applied.  He was taken off the [facility] table and taken to recovery room in stable condition with all final counts being correct.  No complications noted.  Of  ?note, Erskine Emery, PA-C did assist in entire case from beginning to end.  His assistance was medically necessary.  He was appropriate for retracting of soft tissues and management soft tissues as well as helping guide implants and a layered closure of  ?the wound. ? ? ?VAI ?D: 05/02/2021 10:46:42 am T: 05/02/2021 11:56:00 pm  ?JOB: [id number]/ [id number]  ?

## 2021-05-02 NOTE — Evaluation (Signed)
Physical Therapy Evaluation ?Patient Details ?Name: Brian Salazar ?MRN: [id number] ?DOB: 02-Sep-1955 ?Today's Date: 05/02/2021 ? ?History of Present Illness ? Pt is a 66yo male presenting s/p L-THA, anterior approach on 05/02/21. PMH: OA, GERD, HTN, R-THA 2014.  ?Clinical Impression ? Brian Salazar is a 66 y.o. male POD 0 s/p L-THA, anterior approach. Patient reports independence with mobility at baseline. Patient is now limited by functional impairments (see PT problem list below) and requires min assist for bed mobility and transfers. Mobility limited by continued numbness of pt's R foot and slight buckling with steps at EOB. Patient instructed in exercise to facilitate ROM and circulation to manage edema. Patient will benefit from continued skilled PT interventions to address impairments and progress towards PLOF. Acute PT will follow to progress mobility and stair training in preparation for safe discharge home.   ?   ? ?Recommendations for follow up therapy are one component of a multi-disciplinary discharge planning process, led by the attending physician.  Recommendations may be updated based on patient status, additional functional criteria and insurance authorization. ? ?Follow Up Recommendations Follow physician's recommendations for discharge plan and follow up therapies ? ?  ?Assistance Recommended at Discharge Set up Supervision/Assistance  ?Patient can return home with the following ? A little help with walking and/or transfers;A little help with bathing/dressing/bathroom;Assistance with cooking/housework;Assist for transportation;Help with stairs or ramp for entrance ? ?  ?Equipment Recommendations Rolling walker (2 wheels) (Pt reports he is borrowing a walker from his aunt, but unsure of the age/condition so recommending one be provided for him.)  ?Recommendations for Other Services ?    ?  ?Functional Status Assessment Patient has had a recent decline in their functional status and demonstrates the  ability to make significant improvements in function in a reasonable and predictable amount of time.  ? ?  ?Precautions / Restrictions Precautions ?Precautions: Fall ?Restrictions ?Weight Bearing Restrictions: No ?LLE Weight Bearing: Weight bearing as tolerated  ? ?  ? ?Mobility ? Bed Mobility ?Overal bed mobility: Needs Assistance ?Bed Mobility: Supine to Sit ?  ?  ?Supine to sit: Min assist ?  ?  ?General bed mobility comments: Min assist for advancement of LLE off bed, VCs for sequencing and hand placement. ?  ? ?Transfers ?Overall transfer level: Needs assistance ?Equipment used: Rolling walker (2 wheels) ?Transfers: Sit to/from Stand, Bed to chair/wheelchair/BSC ?Sit to Stand: Min guard ?  ?Step pivot transfers: Min assist ?  ?  ?  ?General transfer comment: For sit to stand pt min guard for safety only, no physical assist required. VCs for powering up through RLE and BUE. For step-pivot transfer, pt min assist for steadying secondary to decreased sensation through R foot, verbal cues for sequencing and using BUE to slowly lower down to recliner seat. No overt LOB. ?  ? ?Ambulation/Gait ?  ?  ?  ?  ?  ?  ?  ?General Gait Details: decreased ? ?Stairs ?  ?  ?  ?  ?  ? ?Wheelchair Mobility ?  ? ?Modified Rankin (Stroke Patients Only) ?  ? ?  ? ?Balance Overall balance assessment: Needs assistance ?Sitting-balance support: Feet supported, No upper extremity supported ?Sitting balance-Leahy Scale: Fair ?  ?  ?Standing balance support: Bilateral upper extremity supported, During functional activity, Reliant on assistive device for balance ?Standing balance-Leahy Scale: Poor ?  ?  ?  ?  ?  ?  ?  ?  ?  ?  ?  ?  ?   ? ? ? ?  Pertinent Vitals/Pain Pain Assessment ?Pain Assessment: 0-10 ?Pain Score: 4  ?Pain Location: L hip ?Pain Descriptors / Indicators: Operative site guarding, Discomfort ?Pain Intervention(s): Limited activity within patient's tolerance, Monitored during session, Repositioned  ? ? ?Home Living  Family/patient expects to be discharged to:: Private residence ?Living Arrangements: Spouse/significant other ?Available Help at Discharge: Friend(s);Available 24 hours/day ?Type of Home: House ?Home Access: Stairs to enter ?Entrance Stairs-Rails: Right;Left ?Entrance Stairs-Number of Steps: 3 ?  ?  ?Home Equipment: None ?   ?  ?Prior Function Prior Level of Function : Independent/Modified Independent ?  ?  ?  ?  ?  ?  ?Mobility Comments: ind ?ADLs Comments: ind ?  ? ? ?Hand Dominance  ? Dominant Hand: Right ? ?  ?Extremity/Trunk Assessment  ? Upper Extremity Assessment ?Upper Extremity Assessment: Overall WFL for tasks assessed ?  ? ?Lower Extremity Assessment ?Lower Extremity Assessment: RLE deficits/detail;LLE deficits/detail ?RLE Deficits / Details: MMT: ank/df 5/5 ?RLE Sensation: decreased light touch (still numb from surgery) ?LLE Deficits / Details: MMT: ank/df 5/5 ?LLE Sensation: WNL ?  ? ?Cervical / Trunk Assessment ?Cervical / Trunk Assessment: Normal  ?Communication  ? Communication: No difficulties  ?Cognition Arousal/Alertness: Awake/alert ?Behavior During Therapy: [facility] for tasks assessed/performed ?Overall Cognitive Status: Within Functional Limits for tasks assessed ?  ?  ?  ?  ?  ?  ?  ?  ?  ?  ?  ?  ?  ?  ?  ?  ?  ?  ?  ? ?  ?General Comments   ? ?  ?Exercises Total Joint Exercises ?Ankle Circles/Pumps: AROM, Both, 20 reps ?Quad Sets: AROM, Left, Other reps (comment) (2)  ? ?Assessment/Plan  ?  ?PT Assessment Patient needs continued PT services  ?PT Problem List Decreased strength;Decreased range of motion;Decreased activity tolerance;Decreased balance;Decreased mobility;Decreased coordination;Pain ? ?   ?  ?PT Treatment Interventions DME instruction;Gait training;Stair training;Functional mobility training;Therapeutic activities;Therapeutic exercise;Balance training;Neuromuscular re-education;Patient/family education   ? ?PT Goals (Current goals can be found in the Care Plan section)  ?Acute Rehab  PT Goals ?Patient Stated Goal: To be able to work out with pain, especially climb a ladder ?PT Goal Formulation: With patient ?Time For Goal Achievement: 05/09/21 ?Potential to Achieve Goals: Good ? ?  ?Frequency 7X/week ?  ? ? ?Co-evaluation   ?  ?  ?  ?  ? ? ?  ?AM-PAC PT "6 Clicks" Mobility  ?Outcome Measure Help needed turning from your back to your side while in a flat bed without using bedrails?: None ?Help needed moving from lying on your back to sitting on the side of a flat bed without using bedrails?: A Little ?Help needed moving to and from a bed to a chair (including a wheelchair)?: A Little ?Help needed standing up from a chair using your arms (e.g., wheelchair or bedside chair)?: A Little ?Help needed to walk in hospital room?: A Little ?Help needed climbing 3-5 steps with a railing? : A Little ?6 Click Score: 19 ? ?  ?End of Session Equipment Utilized During Treatment: Gait belt ?Activity Tolerance: Patient tolerated treatment well ?Patient left: in chair;with call bell/phone within reach;with chair alarm set ?Nurse Communication: Mobility status ?PT Visit Diagnosis: Pain;Difficulty in walking, not elsewhere classified (R26.2) ?Pain - Right/Left: Left ?Pain - part of body: Hip ?  ? ?Time: 4098-1191 ?PT Time Calculation (min) (ACUTE ONLY): 27 min ? ? ?Charges:   PT Evaluation ?$PT Eval Low Complexity: 1 Low ?PT Treatments ?$Therapeutic Activity: 8-22 mins ?  ?   ? ? ?  Coolidge Breeze, PT, DPT ?[facility] Rehabilitation Department ?Office: [phone] ?Pager: [phone] ? ?Coolidge Breeze ?05/02/2021, 4:11 PM ? ?

## 2021-05-02 NOTE — Progress Notes (Signed)
Pt ambulated to bathroom with walker. Moderate amount of bright red blood noted in toilet and continuing to drip from pts penis. When pt got up off the toilet, he became lightheaded and began to fall. Pt assisted back to toilet with help of this Probation officer and pts visitor. Vitals taken, BP 91/56. MD blackman was notified at bedside, see new orders.  ?

## 2021-05-03 ENCOUNTER — Other Ambulatory Visit: Payer: Self-pay

## 2021-05-03 DIAGNOSIS — M1612 Unilateral primary osteoarthritis, left hip: Secondary | ICD-10-CM | POA: Diagnosis not present

## 2021-05-03 LAB — BASIC METABOLIC PANEL
Anion gap: 6 (ref 5–15)
BUN: 20 mg/dL (ref 8–23)
CO2: 24 mmol/L (ref 22–32)
Calcium: 8.2 mg/dL — ABNORMAL LOW (ref 8.9–10.3)
Chloride: 108 mmol/L (ref 98–111)
Creatinine, Ser: 0.99 mg/dL (ref 0.61–1.24)
GFR, Estimated: >60 mL/min (ref >60)
Glucose, Bld: 156 mg/dL — ABNORMAL HIGH (ref 70–99)
Potassium: 4 mmol/L (ref 3.5–5.1)
Sodium: 138 mmol/L (ref 135–145)

## 2021-05-03 LAB — CBC
HCT: 36.7 % — ABNORMAL LOW (ref 39.0–52.0)
Hemoglobin: 12.4 g/dL — ABNORMAL LOW (ref 13.0–17.0)
MCH: 30.5 pg (ref 26.0–34.0)
MCHC: 33.8 g/dL (ref 30.0–36.0)
MCV: 90.4 fL (ref 80.0–100.0)
Platelets: 169 10*3/uL (ref 150–400)
RBC: 4.06 MIL/uL — ABNORMAL LOW (ref 4.22–5.81)
RDW: 13.2 % (ref 11.5–15.5)
WBC: 13.4 10*3/uL — ABNORMAL HIGH (ref 4.0–10.5)
nRBC: 0 % (ref 0.0–0.2)

## 2021-05-03 MED ORDER — METHOCARBAMOL 500 MG PO TABS
500.0000 mg | ORAL_TABLET | Freq: Four times a day (QID) | ORAL | 1 refills | Status: AC | PRN
Start: 2021-05-03 — End: ?

## 2021-05-03 MED ORDER — ASPIRIN 81 MG PO CHEW: 81.0000 mg | CHEWABLE_TABLET | Freq: Two times a day (BID) | ORAL | 1 refills | Status: AC

## 2021-05-03 MED ORDER — OXYCODONE HCL 5 MG PO TABS: 5.0000 mg | ORAL_TABLET | ORAL | 0 refills | Status: AC | PRN

## 2021-05-03 MED ORDER — MAGNESIUM OXIDE -MG SUPPLEMENT 400 (240 MG) MG PO TABS
400.0000 mg | ORAL_TABLET | Freq: Every day | ORAL | Status: DC
Start: 2021-05-03 — End: 2021-05-04
  Administered 2021-05-03 – 2021-05-04 (×2): 400 mg via ORAL
  Filled 2021-05-03 (×2): qty 1

## 2021-05-03 NOTE — Discharge Instructions (Signed)

## 2021-05-03 NOTE — Progress Notes (Signed)
Physical Therapy Treatment ?Patient Details ?Name: Brian Salazar ?MRN: [id number] ?DOB: August 18, 1955 ?Today's Date: 05/03/2021 ? ? ?History of Present Illness Pt is a 66yo male presenting s/p L-THA, anterior approach on 05/02/21. PMH: OA, GERD, HTN, R-THA 2014. ? ?  ?PT Comments  ? ? Focus of session today was functional mobility, transfers, and gait. The patient tolerated well and ambulated 400 feet min guard with RW and antalgic gait.  Pt. Shows overall improvement with his transfer ability and L LE control. L LE pain, strength, ROM, and overall balance are still limiting function. Pt. Would benefit from skilled PT to continue to address his functional mobility, balance, gait, and endurance. Pt educated on quad sets (5-10/hour), ankle pumps (20/hour), and heel slides (5-10/hour) to perform this afternoon/evening to lessen stiffness and increase circulation, to pt's tolerance and limited by pain. As well at home walking program to improve functional endurance and strength. Plan and discharge setting remains unchanged. Pt to follow acutely as appropriate.  ?   ?Recommendations for follow up therapy are one component of a multi-disciplinary discharge planning process, led by the attending physician.  Recommendations may be updated based on patient status, additional functional criteria and insurance authorization. ? ?Follow Up Recommendations ? Follow physician's recommendations for discharge plan and follow up therapies ?  ?  ?Assistance Recommended at Discharge Set up Supervision/Assistance  ?Patient can return home with the following A little help with walking and/or transfers;A little help with bathing/dressing/bathroom;Assistance with cooking/housework;Assist for transportation;Help with stairs or ramp for entrance ?  ?Equipment Recommendations ? Rolling walker (2 wheels)  ?  ?   ?Precautions / Restrictions Precautions ?Precautions: Fall;Anterior Hip ?Precaution Booklet Issued: No ?Restrictions ?Weight Bearing  Restrictions: Yes ?LLE Weight Bearing: Weight bearing as tolerated  ?  ? ?Mobility ? Bed Mobility ?Overal bed mobility: Needs Assistance ?Bed Mobility: Supine to Sit ?  ?  ?Supine to sit: Min assist ?  ?  ?General bed mobility comments: Min A for LLE management ?  ? ?Transfers ?Overall transfer level: Needs assistance ?Equipment used: Rolling walker (2 wheels) ?Transfers: Sit to/from Stand, Bed to chair/wheelchair/BSC ?Sit to Stand: Min guard ?  ?Step pivot transfers: Min guard ?  ?  ?  ?General transfer comment: Min guard for saftey only, pt leans forward and pushes up from walker. Min guard for step pivot and lower to chair for saftey. ?  ? ?Ambulation/Gait ?Ambulation/Gait assistance: Min guard ?Gait Distance (Feet): 400 Feet ?Assistive device: Rolling walker (2 wheels) ?Gait Pattern/deviations: Step-through pattern, Decreased step length - left, Decreased weight shift to left ?Gait velocity: decreased ?  ?  ?General Gait Details: Good stability and control during gait, mild decreased in weight shift to L side ? ? ? ? ? ?  ?Balance Overall balance assessment: Needs assistance ?Sitting-balance support: Feet supported, No upper extremity supported ?Sitting balance-Leahy Scale: Fair ?Sitting balance - Comments: Able to sit EOB and lean w/o difficulty ?  ?Standing balance support: Bilateral upper extremity supported, During functional activity, Reliant on assistive device for balance ?Standing balance-Leahy Scale: Poor ?Standing balance comment: Uses RW for standing and dynamic activities ?  ?  ?  ?  ?  ?  ?  ?  ?  ?  ?  ?  ? ?  ?Cognition Arousal/Alertness: Awake/alert ?Behavior During Therapy: [facility] for tasks assessed/performed ?Overall Cognitive Status: Within Functional Limits for tasks assessed ?  ?  ?  ?  ?  ?  ?  ?  ?  ?  ?  ?  ?  ?  ?  ?  ?  ?  ?  ? ?  ?  Exercises Total Joint Exercises ?Ankle Circles/Pumps: AROM, Both, 20 reps ?Quad Sets: AROM, Left, 10 reps ?Heel Slides: AAROM, Left, 5 reps (with strap for  assist) ? ?  ?General Comments General comments (skin integrity, edema, etc.): No complaints of lightheadedness, nausea. BP 135/76 supine, 131/89 sitting EOB ?  ?  ? ?Pertinent Vitals/Pain Pain Assessment ?Pain Assessment: Faces ?Faces Pain Scale: Hurts little more ?Pain Location: L hip ?Pain Descriptors / Indicators: Operative site guarding, Discomfort ?Pain Intervention(s): Limited activity within patient's tolerance, Monitored during session, Premedicated before session  ? ? ? ?PT Goals (current goals can now be found in the care plan section) Acute Rehab PT Goals ?Patient Stated Goal: To be able to work w/o pain, especially climb a ladder ?PT Goal Formulation: With patient ?Time For Goal Achievement: 05/09/21 ?Potential to Achieve Goals: Good ?Progress towards PT goals: Progressing toward goals ? ?  ?Frequency ? ? ? 7X/week ? ? ? ?  ?PT Plan Current plan remains appropriate  ? ? ?   ?AM-PAC PT "6 Clicks" Mobility   ?Outcome Measure ? Help needed turning from your back to your side while in a flat bed without using bedrails?: None ?Help needed moving from lying on your back to sitting on the side of a flat bed without using bedrails?: A Little ?Help needed moving to and from a bed to a chair (including a wheelchair)?: A Little ?Help needed standing up from a chair using your arms (e.g., wheelchair or bedside chair)?: A Little ?Help needed to walk in hospital room?: A Little ?Help needed climbing 3-5 steps with a railing? : A Little ?6 Click Score: 19 ? ?  ?End of Session Equipment Utilized During Treatment: Gait belt ?Activity Tolerance: Patient tolerated treatment well ?Patient left: in chair;with call bell/phone within reach;with family/visitor present (Pt states he will call RN before getting up) ?Nurse Communication: Mobility status ?PT Visit Diagnosis: Pain;Difficulty in walking, not elsewhere classified (R26.2) ?Pain - Right/Left: Left ?Pain - part of body: Hip ?  ? ? ?Time: 1610-9604 ?PT Time Calculation  (min) (ACUTE ONLY): 26 min ? ?Charges:  $Gait Training: 8-22 mins ?$Therapeutic Activity: 8-22 mins          ?          ? ?Thermon Leyland, SPT ?Acute Rehab Services ? ? ? ?Thermon Leyland ?05/03/2021, 11:58 AM ? ? ? ?

## 2021-05-03 NOTE — Consult Note (Signed)
? ?Urology Consult Note  ? ?Requesting Attending Physician:  Mcarthur Rossetti,* ?Service Providing Consult: Urology  ?Consulting Attending: Dr. Aleen Campi ? ? ?Reason for Consult:  Gross hematuria ? ?HPI: Brian Salazar is seen in consultation for reasons noted above at the request of Mcarthur Rossetti,* for evaluation of gross hematuria, clots per urethra. ? ?This is a 66 y.o. 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 They were able to place a catheter; however, it did not per report drain well. Upon removal of the catheter there was some bleeding per urethra; however, this seemed to improve. Unfortunately, today he had another episode of profuse urethral bleeding with a large amount of blood clots collecting in the toilet bowel. Urology was consulted for assistance with this issue.  ? ? ?Past Medical History: ?Past Medical History:  ?Diagnosis Date  ? Arthritis   ? OA RIGHT HIP  ? Cancer [facility])   ? basal cell  ? GERD (gastroesophageal reflux disease)   ? occas  ? Hypertension   ? Shortness of breath   ? with exertion  ? ? ?Past Surgical History:  ?Past Surgical History:  ?Procedure Laterality Date  ? BIOPSY  07/28/2013  ? Procedure: BIOPSY;  Surgeon: Danie Binder, MD;  Location: [facility];  Service: Endoscopy;;  ? COLONOSCOPY    ? COLONOSCOPY N/A 07/28/2013  ? Procedure: COLONOSCOPY;  Surgeon: Danie Binder, MD;  Location: [facility];  Service: Endoscopy;  Laterality: N/A;  10:45 AM  ? HERNIA REPAIR    ? POLYPECTOMY  07/28/2013  ? Procedure: POLYPECTOMY;  Surgeon: Danie Binder, MD;  Location: [facility];  Service: Endoscopy;;  ? TOTAL HIP ARTHROPLASTY Right 09/16/2012  ? Procedure: RIGHT TOTAL HIP ARTHROPLASTY ANTERIOR APPROACH;  Surgeon: Mcarthur Rossetti, MD;  Location: [facility];  Service: Orthopedics;  Laterality: Right;   Right Total Hip Arthroplasty, Anterior Approach  ? ? ?Medication: ?Current Facility-Administered Medications  ?Medication Dose Route Frequency Provider Last Rate Last Admin  ? 0.9 %  sodium chloride infusion   Intravenous Continuous Mcarthur Rossetti, MD   Stopped at 05/03/21 [id number]  ? acetaminophen (TYLENOL) tablet 325-650 mg  325-650 mg Oral Q6H PRN Mcarthur Rossetti, MD      ? alum & mag hydroxide-simeth (MAALOX/MYLANTA) 200-200-20 MG/5ML suspension 30 mL  30 mL Oral Q4H PRN Mcarthur Rossetti, MD      ? aspirin chewable tablet 81 mg  81 mg Oral BID Mcarthur Rossetti, MD   81 mg at 05/03/21 0856  ? diphenhydrAMINE (BENADRYL) 12.5 MG/5ML elixir 12.5-25 mg  12.5-25 mg Oral Q4H PRN Mcarthur Rossetti, MD      ? docusate sodium (COLACE) capsule 100 mg  100 mg Oral BID Mcarthur Rossetti, MD   100 mg at 05/03/21 0857  ? lisinopril (ZESTRIL) tablet 20 mg  20 mg Oral Daily Mcarthur Rossetti, MD   20 mg at 05/03/21 0857  ? And  ? hydrochlorothiazide (HYDRODIURIL) tablet 12.5 mg  12.5 mg Oral Daily Mcarthur Rossetti, MD   12.5 mg at 05/03/21 0857  ? HYDROmorphone (DILAUDID) injection 0.5-1 mg  0.5-1 mg Intravenous Q4H PRN Mcarthur Rossetti, MD      ? oxyCODONE (Oxy IR/ROXICODONE) immediate release tablet 5-10 mg  5-10 mg Oral Q4H PRN Mcarthur Rossetti, MD  10 mg at 05/03/21 1337  ? Or  ? HYDROmorphone (DILAUDID) tablet 1-2 mg  1-2 mg Oral Q4H PRN Mcarthur Rossetti, MD      ? oxyCODONE (Oxy IR/ROXICODONE) immediate release tablet 10-15 mg  10-15 mg Oral Q4H PRN Mcarthur Rossetti, MD   10 mg at 05/03/21 0133  ? Or  ? HYDROmorphone (DILAUDID) tablet 2-3 mg  2-3 mg Oral Q4H PRN Mcarthur Rossetti, MD      ? magnesium oxide (MAG-OX) tablet 400 mg  400 mg Oral Daily Mcarthur Rossetti, MD   400 mg at 05/03/21 0857  ? menthol-cetylpyridinium (CEPACOL) lozenge 3 mg  1 lozenge Oral PRN Mcarthur Rossetti, MD      ? Or  ? phenol (CHLORASEPTIC) mouth  spray 1 spray  1 spray Mouth/Throat PRN Mcarthur Rossetti, MD      ? methocarbamol (ROBAXIN) tablet 500 mg  500 mg Oral Q6H PRN Mcarthur Rossetti, MD   500 mg at 05/03/21 0857  ? Or  ? methocarbamol (ROBAXIN) 500 mg in dextrose 5 % 50 mL IVPB  500 mg Intravenous Q6H PRN Mcarthur Rossetti, MD   Stopping previously hung infusion at 05/02/21 1351  ? metoCLOPramide (REGLAN) tablet 5-10 mg  5-10 mg Oral Q8H PRN Mcarthur Rossetti, MD      ? Or  ? metoCLOPramide (REGLAN) injection 5-10 mg  5-10 mg Intravenous Q8H PRN Mcarthur Rossetti, MD      ? ondansetron [facility]) tablet 4 mg  4 mg Oral Q6H PRN Mcarthur Rossetti, MD      ? Or  ? ondansetron [facility]) injection 4 mg  4 mg Intravenous Q6H PRN Mcarthur Rossetti, MD      ? pantoprazole (PROTONIX) EC tablet 40 mg  40 mg Oral Daily Mcarthur Rossetti, MD   40 mg at 05/03/21 0857  ? rosuvastatin (CRESTOR) tablet 10 mg  10 mg Oral Daily Mcarthur Rossetti, MD   10 mg at 05/03/21 0857  ? ? ?Allergies: ?Allergies  ?Allergen Reactions  ? Simvastatin   ?  Made me feel funny  ? ? ?Social History: ?Social History  ? ?Tobacco Use  ? Smoking status: Never  ? Smokeless tobacco: Never  ?Vaping Use  ? Vaping Use: Never used  ?Substance Use Topics  ? Alcohol use: Yes  ?  Comment: RARE ALCOHOL  ? Drug use: No  ? ? ?Family History ?History reviewed. No pertinent family history. ? ?Review of Systems ?10 systems were reviewed and are negative except as noted specifically in the HPI. ? ?Objective  ? ?Vital signs in last 24 hours: ?BP 116/70 (BP Location: Right Arm)   Pulse 79   Temp 97.7 ?F (36.5 ?C) (Oral)   Resp 18   SpO2 96%  ? ?Physical Exam ?General: NAD, A&O, resting, appropriate ?HEENT: /AT, EOMI, MMM ?Pulmonary: Normal work of breathing ?Cardiovascular: HDS, adequate peripheral perfusion ?Abdomen: Soft, NTTP, nondistended. ?GU: Dorsally hooded uncircumcised foreskin, hypospadic phallus with meatus on the ventral coronal  rim ?Extremities: warm and well perfused ?Neuro: Appropriate, no focal neurological deficits ? ?Most Recent Labs: ?Lab Results  ?Component Value Date  ? WBC 13.4 (H) 05/03/2021  ? HGB 12.4 (L) 05/03/2021  ? HCT 36.7 (L) 05/03/2021  ? PLT 169 05/03/2021  ? ? ?Lab Results  ?Component Value Date  ? NA 138 05/03/2021  ? K 4.0 05/03/2021  ? CL 108 05/03/2021  ? CO2 24 05/03/2021  ? BUN 20 05/03/2021  ?  CREATININE 0.99 05/03/2021  ? CALCIUM 8.2 (L) 05/03/2021  ? ? ?Lab Results  ?Component Value Date  ? INR 0.98 09/09/2012  ? APTT 29 09/09/2012  ? ? ? ?IMAGING: ?DG Pelvis Portable ? ?Result Date: 05/02/2021 ?CLINICAL DATA:  Status post total left hip arthroplasty. Image done in PACU. EXAM: PORTABLE PELVIS 1-2 VIEWS COMPARISON:  AP pelvis 09/16/2012 FINDINGS: Interval total left hip arthroplasty. No perihardware lucency is seen to indicate hardware failure or loosening. Expected postoperative changes of subcutaneous air about the left hip. Redemonstration of total right hip arthroplasty without evidence of hardware failure. Mild joint space narrowing of the pubic symphysis, similar to prior. IMPRESSION: Interval total left hip arthroplasty without evidence of hardware failure. Electronically Signed   By: Yvonne Kendall M.D.   On: 05/02/2021 11:36  ? ?DG HIP UNILAT WITH PELVIS 1V LEFT ? ?Result Date: 05/02/2021 ?CLINICAL DATA:  Fluoroscopy for total left hip arthroplasty. EXAM: DG HIP (WITH OR WITHOUT PELVIS) 1V*L* COMPARISON:  Pelvis and left hip radiographs 02/19/2021 FINDINGS: Images were performed intraoperatively without the presence of a radiologist. Interval total left hip arthroplasty. Right hip arthroplasty hardware is again noted. A Foley catheter overlies the inferior midline pelvis. Total fluoroscopy images: 3 Total fluoroscopy time: 9 seconds Please see intraoperative findings for further detail. IMPRESSION: Fluoroscopy for total left hip arthroplasty. Electronically Signed   By: Yvonne Kendall M.D.   On: 05/02/2021  11:15   ? ?------ ? ?Assessment: ? ?66 y.o. male with Hx of left total hip replacement (4/14) and mild coronal hypospadias with likely traumatic catheter placement. He is voiding well; however, due to his profoun

## 2021-05-03 NOTE — Progress Notes (Addendum)
Physical Therapy Treatment ?Patient Details ?Name: Brian Salazar ?MRN: [id number] ?DOB: 1955/08/16 ?Today's Date: 05/03/2021 ? ? ?History of Present Illness Pt is a 66yo male presenting s/p L-THA, anterior approach on 05/02/21. PMH: OA, GERD, HTN, R-THA 2014. ? ?  ?PT Comments  ? ? Pt was seen for second session today to focus on functional mobility, tranfers, and gait. He shows improved ability to come from supine to EOB sitting and perform STS supervision level with RW. He still complains of stiffness and pain in the L hip during short distance gait. He was also able to self lower onto the toilet with the help of a wall mounted rail. The session was limited today secondary to pt having profuse bleeding from his urethra. SPT provided care while he was toileting and pt reports no dizziness or lightheadedness throughout. RN notified and pt was handed off on the toilet for further care. Overall L LE strength, coordination, and are still limiting function. Pt. Would benefit from skilled PT to continue to address deficits in functional tranfers, balance, gait, and stairs. Plan and discharge setting remains unchanged. Pt to follow acutely as appropriate.  ?   ?Recommendations for follow up therapy are one component of a multi-disciplinary discharge planning process, led by the attending physician.  Recommendations may be updated based on patient status, additional functional criteria and insurance authorization. ? ?Follow Up Recommendations ? Follow physician's recommendations for discharge plan and follow up therapies ?  ?  ?Assistance Recommended at Discharge Set up Supervision/Assistance  ?Patient can return home with the following A little help with walking and/or transfers;A little help with bathing/dressing/bathroom;Assistance with cooking/housework;Assist for transportation;Help with stairs or ramp for entrance ?  ?Equipment Recommendations ? Rolling walker (2 wheels)  ?  ?Recommendations for Other Services   ? ? ?   ?Precautions / Restrictions Precautions ?Precautions: Fall;Anterior Hip ?Precaution Booklet Issued: No ?Restrictions ?Weight Bearing Restrictions: Yes ?LLE Weight Bearing: Weight bearing as tolerated  ?  ? ?Mobility ? Bed Mobility ?Overal bed mobility: Needs Assistance ?Bed Mobility: Supine to Sit ?  ?  ?Supine to sit: Supervision, HOB elevated ?  ?  ?General bed mobility comments: Pt. was able to sit EOB without assistance ?  ? ?Transfers ?Overall transfer level: Needs assistance ?Equipment used: Rolling walker (2 wheels) ?Transfers: Sit to/from Stand, Bed to chair/wheelchair/BSC ?Sit to Stand: Supervision ?  ?Step pivot transfers: Min guard ?  ?  ?  ?General transfer comment: Min guard step pivot to toilet, able to lower himself to toilet with help of wall mounted rail. ?  ? ?Ambulation/Gait ?Ambulation/Gait assistance: Min guard ?Gait Distance (Feet): 7 Feet ?Assistive device: Rolling walker (2 wheels) ?Gait Pattern/deviations: Step-through pattern, Decreased step length - left, Decreased weight shift to left ?Gait velocity: decreased ?  ?  ?General Gait Details: Good stability and control walking to bathroom ? ? ?Stairs ?  ?  ?  ?  ?  ? ? ?Wheelchair Mobility ?  ? ?Modified Rankin (Stroke Patients Only) ?  ? ? ?  ?Balance Overall balance assessment: Needs assistance ?Sitting-balance support: Feet supported, No upper extremity supported ?Sitting balance-Leahy Scale: Fair ?Sitting balance - Comments: Able to sit EOB and on toilet performing pericare w/o difficulty ?  ?Standing balance support: Bilateral upper extremity supported, During functional activity, Reliant on assistive device for balance ?Standing balance-Leahy Scale: Poor ?Standing balance comment: Uses RW for standing and dynamic activities ?  ?  ?  ?  ?  ?  ?  ?  ?  ?  ?  ?  ? ?  ?  Cognition Arousal/Alertness: Awake/alert ?Behavior During Therapy: [facility] for tasks assessed/performed ?Overall Cognitive Status: Within Functional Limits for tasks assessed ?   ?  ?  ?  ?  ?  ?  ?  ?  ?  ?  ?  ?  ?  ?  ?  ?  ?  ?  ? ?  ?Exercises Total Joint Exercises ?Ankle Circles/Pumps: AROM, Both, 20 reps ?Quad Sets: AROM, Left, 10 reps ?Heel Slides: AAROM, Left, 5 reps (with strap for assist) ? ?  ?General Comments General comments (skin integrity, edema, etc.): No complaints of dizziness/lightheadedness during session. ?  ?  ? ?Pertinent Vitals/Pain Pain Assessment ?Pain Assessment: Faces ?Faces Pain Scale: Hurts little more ?Pain Location: L hip, penis ?Pain Descriptors / Indicators: Operative site guarding, Discomfort ?Pain Intervention(s): Limited activity within patient's tolerance, Monitored during session  ? ? ?Home Living   ?  ?  ?  ?  ?  ?  ?  ?  ?  ?   ?  ?Prior Function    ?  ?  ?   ? ?PT Goals (current goals can now be found in the care plan section) Acute Rehab PT Goals ?Patient Stated Goal: To be able to work w/o pain, especially climb a ladder ?PT Goal Formulation: With patient ?Time For Goal Achievement: 05/09/21 ?Potential to Achieve Goals: Good ?Progress towards PT goals: Progressing toward goals ? ?  ?Frequency ? ? ? 7X/week ? ? ? ?  ?PT Plan Current plan remains appropriate  ? ? ?Co-evaluation   ?  ?  ?  ?  ? ?  ?AM-PAC PT "6 Clicks" Mobility   ?Outcome Measure ? Help needed turning from your back to your side while in a flat bed without using bedrails?: None ?Help needed moving from lying on your back to sitting on the side of a flat bed without using bedrails?: A Little ?Help needed moving to and from a bed to a chair (including a wheelchair)?: A Little ?Help needed standing up from a chair using your arms (e.g., wheelchair or bedside chair)?: A Little ?Help needed to walk in hospital room?: A Little ?Help needed climbing 3-5 steps with a railing? : A Little ?6 Click Score: 19 ? ?  ?End of Session Equipment Utilized During Treatment: Gait belt ?Activity Tolerance: Patient tolerated treatment well ?Patient left: in chair;with call bell/phone within reach;with  family/visitor present ?Nurse Communication: Mobility status ?PT Visit Diagnosis: Pain;Difficulty in walking, not elsewhere classified (R26.2) ?Pain - Right/Left: Left ?Pain - part of body: Hip ?  ? ? ?Time: 8657-8469 ?PT Time Calculation (min) (ACUTE ONLY): 15 min ? ?Charges:       ?  $Therapeutic Activity: 8-22 mins       ? ?Thermon Leyland, SPT ?Acute Rehab Services ? ? ? ?Thermon Leyland ?05/03/2021, 3:09 PM ? ? ? ?

## 2021-05-03 NOTE — Progress Notes (Signed)
Subjective: ?1 Day Post-Op Procedure(s) (LRB): ?LEFT TOTAL HIP ARTHROPLASTY ANTERIOR APPROACH (Left) ?Patient reports pain as moderate.   ? ?Objective: ?Vital signs in last 24 hours: ?Temp:  [97.6 ?F (36.4 ?C)-98.6 ?F (37 ?C)] 97.9 ?F (36.6 ?C) (04/15 0419) ?Pulse Rate:  [59-94] 72 (04/15 0419) ?Resp:  [9-25] 17 (04/15 0419) ?BP: (100-148)/(58-91) 100/58 (04/15 0419) ?SpO2:  [96 %-100 %] 98 % (04/15 0419) ? ?Intake/Output from previous day: ?04/14 0701 - 04/15 0700 ?In: 2958.8 [P.O.:240; I.V.:2468.8; IV Piggyback:250] ?Out: 210 [Urine:10; Blood:200] ?Intake/Output this shift: ?No intake/output data recorded. ? ?Recent Labs  ?  05/03/21 ?0327  ?HGB 12.4*  ? ?Recent Labs  ?  05/03/21 ?0327  ?WBC 13.4*  ?RBC 4.06*  ?HCT 36.7*  ?PLT 169  ? ?Recent Labs  ?  05/03/21 ?0327  ?NA 138  ?K 4.0  ?CL 108  ?CO2 24  ?BUN 20  ?CREATININE 0.99  ?GLUCOSE 156*  ?CALCIUM 8.2*  ? ?No results for input(s): LABPT, INR in the last 72 hours. ? ?Sensation intact distally ?Intact pulses distally ?Dorsiflexion/Plantar flexion intact ?Incision: scant drainage ? ? ?Assessment/Plan: ?1 Day Post-Op Procedure(s) (LRB): ?LEFT TOTAL HIP ARTHROPLASTY ANTERIOR APPROACH (Left) ?Up with therapy ?Plan for discharge tomorrow ?Discharge home with home health ? ? ? ? ? ?Brian Salazar ?05/03/2021, 9:50 AM ? ?

## 2021-05-03 NOTE — TOC Initial Note (Signed)
Transition of Care (TOC) - Initial/Assessment Note  ? ? ?Patient Details  ?Name: Brian Salazar ?MRN: [id number] ?Date of Birth: 10/15/1955 ? ?Transition of Care (TOC) CM/SW Contact:    ?Elizabeth N Grant, RN ?Phone Number: ?05/03/2021, 10:12 AM ? ?Clinical Narrative:                 ? ?Met with patient at bedside.  States he needs a RW, does not need a 3-in-1.  States DME choice is [employer].  [employer] rep notified and they will deliver the RW to the room.  ?Patient already set up with HH with [facility] by the ortho office.   ?No further TOC needs.  ? ? ?Expected Discharge Plan: Home w Home Health Services ?Barriers to Discharge: Continued Medical Work up ? ? ?Patient Goals and CMS Choice ?Patient states their goals for this hospitalization and ongoing recovery are:: return home ?  ?Choice offered to / list presented to : Patient ? ?Expected Discharge Plan and Services ?Expected Discharge Plan: Home w Home Health Services ?  ?  ?Post Acute Care Choice: Durable Medical Equipment ?  ?                ?DME Arranged: Walker rolling ?DME Agency: [employer] ?Date DME Agency Contacted: 05/03/21 ?Time DME Agency Contacted: 1001 ?Representative spoke with at DME Agency: Jasmine ?HH Arranged: PT ?HH Agency: [facility] ?  ?  ?Representative spoke with at HH Agency: HH arranged by ortho office ? ?Prior Living Arrangements/Services ?  ?  ?Patient language and need for interpreter reviewed:: Yes ?       ?Need for Family Participation in Patient Care: Yes (Comment) ?Care giver support system in place?: Yes (comment) ?  ?Criminal Activity/Legal Involvement Pertinent to Current Situation/Hospitalization: No - Comment as needed ? ?Activities of Daily Living ?Home Assistive Devices/Equipment: None ?ADL Screening (condition at time of admission) ?Patient's cognitive ability adequate to safely complete daily activities?: Yes ?Is the patient deaf or have difficulty hearing?: No ?Does the patient have difficulty seeing, even  when wearing glasses/contacts?: No ?Does the patient have difficulty concentrating, remembering, or making decisions?: No ?Patient able to express need for assistance with ADLs?: Yes ?Does the patient have difficulty dressing or bathing?: No ?Independently performs ADLs?: Yes (appropriate for developmental age) ?Does the patient have difficulty walking or climbing stairs?: Yes ?Weakness of Legs: Both ?Weakness of Arms/Hands: None ? ?Emotional Assessment ?  ?Orientation: : Oriented to Self, Oriented to Place, Oriented to  Time, Oriented to Situation ?Alcohol / Substance Use: Not Applicable ?Psych Involvement: No (comment) ? ?Admission diagnosis:  Status post left hip replacement [Z96.642] ?Patient Active Problem List  ? Diagnosis Date Noted  ? Status post left hip replacement 05/02/2021  ? Unilateral primary osteoarthritis, left hip 02/19/2021  ? History of total right hip replacement 07/20/2017  ? Morbid obesity (HCC) 02/15/2015  ? Umbilical hernia without obstruction and without gangrene 06/22/2014  ? Degenerative arthritis of hip, right 09/16/2012  ? Essential hypertension, benign 08/30/2012  ? Hyperlipidemia 08/30/2012  ? Arthralgia of hip 08/30/2012  ? ?PCP:  Skakle, Austin, DO ?Pharmacy:   ?[facility] - [place], [place] - [address] ?[address] ?[place] [address] ?Phone: [phone] Fax: [phone] ? ?[facility] - [place], [place] - [address] ?[address] ?[place]  [address] ?Phone: [phone] Fax: [phone] ? ? ?

## 2021-05-03 NOTE — Plan of Care (Signed)
?  Problem: Education: ?Goal: Knowledge of General Education information will improve ?Description: Including pain rating scale, medication(s)/side effects and non-pharmacologic comfort measures ?Outcome: Progressing ?  ?Problem: Pain Managment: ?Goal: General experience of comfort will improve ?Outcome: Progressing ?  ?Problem: Activity: ?Goal: Ability to avoid complications of mobility impairment will improve ?Outcome: Progressing ?Goal: Ability to tolerate increased activity will improve ?Outcome: Progressing ?  ?

## 2021-05-03 NOTE — Progress Notes (Signed)
Patient assisted to the bathroom while working with physical therapy. I was called into the room by Physical Therapy. Upon entering the room patient was found sitting on the toilet with profuse bleeding from his penis. Patient denies any dizziness/lightheadedness. MD Ninfa Linden notified. Orders for Urology consult. Urology quickly arrived into the room to see patient.  ?

## 2021-05-04 DIAGNOSIS — M1612 Unilateral primary osteoarthritis, left hip: Secondary | ICD-10-CM | POA: Diagnosis not present

## 2021-05-04 MED ORDER — CHLORHEXIDINE GLUCONATE CLOTH 2 % EX PADS
6.0000 | MEDICATED_PAD | Freq: Every day | CUTANEOUS | Status: DC
  Administered 2021-05-04: 6 via TOPICAL

## 2021-05-04 NOTE — Progress Notes (Signed)
Patient ID: Brian Salazar, male   DOB: 07/21/1955, 66 y.o.   MRN: [id number] ?The patient is comfortable this morning.  His vital signs are stable.  His wife is at the bedside.  I appreciate urology and their consult yesterday.  The patient understands that the Foley will need to stay in 48 to 72 hours.  With that being said, he does desire to be discharged to home today and I agree with this as well.  He knows to call [facility] tomorrow morning to establish a follow-up in the next few days at their office.  His left operative hip is stable. ?

## 2021-05-04 NOTE — Progress Notes (Signed)
Physical Therapy Treatment ?Patient Details ?Name: Brian Salazar ?MRN: [id number] ?DOB: 16-Jul-1955 ?Today's Date: 05/04/2021 ? ? ?History of Present Illness Pt is a 66yo male presenting s/p L-THA, anterior approach on 05/02/21. PMH: OA, GERD, HTN, R-THA 2014. ? ?  ?PT Comments  ? ? Pt is progressing well. Reviewed gait, stairs and transfer safety. Pt and wife feel ready to d/c today, PT in agreement. HHPT to begin tomorrow    ?Recommendations for follow up therapy are one component of a multi-disciplinary discharge planning process, led by the attending physician.  Recommendations may be updated based on patient status, additional functional criteria and insurance authorization. ? ?Follow Up Recommendations ? Follow physician's recommendations for discharge plan and follow up therapies ?  ?  ?Assistance Recommended at Discharge Set up Supervision/Assistance  ?Patient can return home with the following A little help with walking and/or transfers;A little help with bathing/dressing/bathroom;Assistance with cooking/housework;Assist for transportation;Help with stairs or ramp for entrance ?  ?Equipment Recommendations ? Rolling walker (2 wheels)  ?  ?Recommendations for Other Services   ? ? ?  ?Precautions / Restrictions Precautions ?Precautions: Fall ?Restrictions ?Weight Bearing Restrictions: No ?LLE Weight Bearing: Weight bearing as tolerated  ?  ? ?Mobility ? Bed Mobility ?  ?  ?  ?  ?  ?  ?  ?General bed mobility comments: in recliner ?  ? ?Transfers ?Overall transfer level: Needs assistance ?Equipment used: Rolling walker (2 wheels) ?Transfers: Sit to/from Stand ?Sit to Stand: Supervision, Min guard ?  ?  ?  ?  ?  ?General transfer comment: for safety ?  ? ?Ambulation/Gait ?Ambulation/Gait assistance: Supervision, Min guard ?Gait Distance (Feet): 360 Feet ?Assistive device: Rolling walker (2 wheels) ?Gait Pattern/deviations: Step-through pattern, Decreased stride length ?Gait velocity: decreased ?  ?  ?General Gait  Details: cues for sequence and RW position. improved fluidity of gait, good stability ? ? ?Stairs ?Stairs: Yes ?Stairs assistance: Min guard ?Stair Management: One rail Right, One rail Left, Step to pattern, Sideways ?Number of Stairs: 3 ?General stair comments: cues for sequence, reviewed with pt wife. good stability, no LOB with single rail support ? ? ?Wheelchair Mobility ?  ? ?Modified Rankin (Stroke Patients Only) ?  ? ? ?  ?Balance   ?  ?  ?  ?  ?  ?  ?  ?  ?  ?  ?  ?  ?  ?  ?  ?  ?  ?  ?  ? ?  ?Cognition Arousal/Alertness: Awake/alert ?Behavior During Therapy: [facility] for tasks assessed/performed ?Overall Cognitive Status: Within Functional Limits for tasks assessed ?  ?  ?  ?  ?  ?  ?  ?  ?  ?  ?  ?  ?  ?  ?  ?  ?  ?  ?  ? ?  ?Exercises   ? ?  ?General Comments   ?  ?  ? ?Pertinent Vitals/Pain Pain Assessment ?Pain Assessment: 0-10 ?Faces Pain Scale: Hurts little more ?Pain Location: L hip ?Pain Descriptors / Indicators: Operative site guarding, Discomfort ?Pain Intervention(s): Limited activity within patient's tolerance, Monitored during session, Premedicated before session, Repositioned  ? ? ?Home Living   ?  ?  ?  ?  ?  ?  ?  ?  ?  ?   ?  ?Prior Function    ?  ?  ?   ? ?PT Goals (current goals can now be found in the care plan section) Acute Rehab  PT Goals ?Patient Stated Goal: To be able to work w/o pain, especially climb a ladder ?PT Goal Formulation: With patient ?Time For Goal Achievement: 05/09/21 ?Potential to Achieve Goals: Good ?Progress towards PT goals: Progressing toward goals ? ?  ?Frequency ? ? ? 7X/week ? ? ? ?  ?PT Plan Current plan remains appropriate  ? ? ?Co-evaluation   ?  ?  ?  ?  ? ?  ?AM-PAC PT "6 Clicks" Mobility   ?Outcome Measure ? Help needed turning from your back to your side while in a flat bed without using bedrails?: None ?Help needed moving from lying on your back to sitting on the side of a flat bed without using bedrails?: A Little ?Help needed moving to and from a bed to a  chair (including a wheelchair)?: A Little ?Help needed standing up from a chair using your arms (e.g., wheelchair or bedside chair)?: A Little ?Help needed to walk in hospital room?: A Little ?Help needed climbing 3-5 steps with a railing? : A Little ?6 Click Score: 19 ? ?  ?End of Session Equipment Utilized During Treatment: Gait belt ?Activity Tolerance: Patient tolerated treatment well ?Patient left: with call bell/phone within reach;with family/visitor present;Other (comment) (in bathroom) ?  ?PT Visit Diagnosis: Pain;Difficulty in walking, not elsewhere classified (R26.2) ?Pain - Right/Left: Left ?Pain - part of body: Hip ?  ? ? ?Time: 1035-1050 ?PT Time Calculation (min) (ACUTE ONLY): 15 min ? ?Charges:  $Gait Training: 8-22 mins          ?          ? Baxter Flattery, PT ? ?Acute Rehab Dept [facility]) [phone] ?Pager [phone] ? ?05/04/2021 ? ? ? ?Akshat Minehart ?05/04/2021, 11:03 AM ? ?

## 2021-05-04 NOTE — Discharge Summary (Signed)
?Patient ID: ?Brian Salazar ?MRN: [id number] ?DOB/AGE: 1955/05/21 66 y.o. ? ?Admit date: 05/02/2021 ?Discharge date: 05/04/2021 ? ?Admission Diagnoses:  ?Principal Problem: ?  Unilateral primary osteoarthritis, left hip ?Active Problems: ?  Status post left hip replacement ? ? ?Discharge Diagnoses:  ?Same ? ?Past Medical History:  ?Diagnosis Date  ? Arthritis   ? OA RIGHT HIP  ? 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 GERD (gastroesophageal reflux disease)   ? occas  ? Hypertension   ? Shortness of breath   ? with exertion  ? ? ?Surgeries: Procedure(s): ?LEFT TOTAL HIP ARTHROPLASTY ANTERIOR APPROACH on 05/02/2021 ?  ?Consultants: Treatment Team:  ?Ceasar Mons, MD ? ?Discharged Condition: Improved ? ?Hospital Course: Brian Salazar is an 66 y.o. male who was admitted 05/02/2021 for operative treatment ofUnilateral primary osteoarthritis, left hip. Patient has severe unremitting pain that affects sleep, daily activities, and work/hobbies. After pre-op clearance the patient was taken to the operating room on 05/02/2021 and underwent  Procedure(s): ?LEFT TOTAL HIP ARTHROPLASTY ANTERIOR APPROACH.   ? ?Patient was given perioperative antibiotics:  ?Anti-infectives (From admission, onward)  ? ? Start     Dose/Rate Route Frequency Ordered Stop  ? 05/02/21 1545  ceFAZolin (ANCEF) IVPB 1 g/50 mL premix       ? 1 g ?100 mL/hr over 30 Minutes Intravenous Every 6 hours 05/02/21 1245 05/02/21 2208  ? 05/02/21 0715  ceFAZolin (ANCEF) IVPB 2g/100 mL premix       ? 2 g ?200 mL/hr over 30 Minutes Intravenous On call to O.R. 05/02/21 6269 05/02/21 0955  ? ?  ?  ? ?Patient was given sequential compression devices, early ambulation, and chemoprophylaxis to prevent DVT. ? ?Patient benefited maximally from hospital stay.  There was bleeding from his urethra potentially from trauma as it relates to his anatomy and Foley placement.  Urology was consulted.  They were able to place a Foley the day before discharge and recommended that Foley  stay in for 48 to 72 hours with outpatient follow-up with [facility]. ? ?Recent vital signs: Patient Vitals for the past 24 hrs: ? BP Temp Temp src Pulse Resp SpO2  ?05/04/21 0507 136/76 97.9 ?F (36.6 ?C) Oral 82 18 98 %  ?05/03/21 2036 (!) 146/77 98.1 ?F (36.7 ?C) -- 80 19 100 %  ?05/03/21 1326 116/70 97.7 ?F (36.5 ?C) Oral 79 18 96 %  ?  ? ?Recent laboratory studies:  ?Recent Labs  ?  05/03/21 ?0327  ?WBC 13.4*  ?HGB 12.4*  ?HCT 36.7*  ?PLT 169  ?NA 138  ?K 4.0  ?CL 108  ?CO2 24  ?BUN 20  ?CREATININE 0.99  ?GLUCOSE 156*  ?CALCIUM 8.2*  ? ? ? ?Discharge Medications:   ?Allergies as of 05/04/2021   ? ?   Reactions  ? Simvastatin   ? Made me feel funny  ? ?  ? ?  ?Medication List  ?  ? ?TAKE these medications   ? ?aspirin 81 MG chewable tablet ?Chew 1 tablet (81 mg total) by mouth 2 (two) times daily. ?  ?lisinopril-hydrochlorothiazide 20-12.5 MG tablet ?Commonly known as: ZESTORETIC ?Take 1 tablet by mouth daily. ?  ?magnesium oxide 400 MG tablet ?Commonly known as: MAG-OX ?Take 400 mg by mouth daily. ?  ?methocarbamol 500 MG tablet ?Commonly known as: ROBAXIN ?Take 1 tablet (500 mg total) by mouth every 6 (six) hours as needed for muscle spasms. ?  ?oxyCODONE 5 MG immediate release tablet ?Commonly known as:  Oxy IR/ROXICODONE ?Take 1-2 tablets (5-10 mg total) by mouth every 4 (four) hours as needed for moderate pain (pain score 4-6). ?  ?rosuvastatin 10 MG tablet ?Commonly known as: Crestor ?Take 1 tablet (10 mg total) by mouth daily. ?  ? ?  ? ?  ?  ? ? ?  ?Durable Medical Equipment  ?(From admission, onward)  ?  ? ? ?  ? ?  Start     Ordered  ? 05/02/21 1246  DME 3 n 1  Once       ? 05/02/21 1245  ? 05/02/21 1246  DME Walker rolling  Once       ?Question Answer Comment  ?Walker: With 5 Inch Wheels   ?Patient needs a walker to treat with the following condition Status post left hip replacement   ?  ? 05/02/21 1245  ? ?  ?  ? ?  ? ? ?Diagnostic Studies: DG Pelvis Portable ? ?Result Date: 05/02/2021 ?CLINICAL  DATA:  Status post total left hip arthroplasty. Image done in PACU. EXAM: PORTABLE PELVIS 1-2 VIEWS COMPARISON:  AP pelvis 09/16/2012 FINDINGS: Interval total left hip arthroplasty. No perihardware lucency is seen to indicate hardware failure or loosening. Expected postoperative changes of subcutaneous air about the left hip. Redemonstration of total right hip arthroplasty without evidence of hardware failure. Mild joint space narrowing of the pubic symphysis, similar to prior. IMPRESSION: Interval total left hip arthroplasty without evidence of hardware failure. Electronically Signed   By: Yvonne Kendall M.D.   On: 05/02/2021 11:36  ? ?DG HIP UNILAT WITH PELVIS 1V LEFT ? ?Result Date: 05/02/2021 ?CLINICAL DATA:  Fluoroscopy for total left hip arthroplasty. EXAM: DG HIP (WITH OR WITHOUT PELVIS) 1V*L* COMPARISON:  Pelvis and left hip radiographs 02/19/2021 FINDINGS: Images were performed intraoperatively without the presence of a radiologist. Interval total left hip arthroplasty. Right hip arthroplasty hardware is again noted. A Foley catheter overlies the inferior midline pelvis. Total fluoroscopy images: 3 Total fluoroscopy time: 9 seconds Please see intraoperative findings for further detail. IMPRESSION: Fluoroscopy for total left hip arthroplasty. Electronically Signed   By: Yvonne Kendall M.D.   On: 05/02/2021 11:15   ? ?Disposition: Discharge disposition: 01-Home or Self Care ? ? ? ? ? ? ? ? ? Follow-up Information   ? ? Mcarthur Rossetti, MD Follow up in 2 week(s).   ?Specialty: Orthopedic Surgery ?Contact information: ?[address] ?[place] [address] ?[phone] ? ? ?  ?  ? ? [place]. Schedule an appointment as soon as possible for a visit in 3 day(s).   ?Contact information: ?[address] ?[place] ?[phone] ? ?  ?  ? ?  ?  ? ?  ? ? ? ?Signed: ?Mcarthur Rossetti ?05/04/2021, 8:10 AM ? ? ? ?

## 2021-05-04 NOTE — Progress Notes (Signed)
Pt provided with DC packet and DC instructions. Family at bedside. All questions answered. IV removed  ?

## 2021-05-05 ENCOUNTER — Encounter (HOSPITAL_COMMUNITY): Payer: Self-pay | Admitting: Orthopaedic Surgery

## 2021-05-14 ENCOUNTER — Ambulatory Visit (INDEPENDENT_AMBULATORY_CARE_PROVIDER_SITE_OTHER): Payer: 59 | Admitting: Orthopaedic Surgery

## 2021-05-14 ENCOUNTER — Encounter: Payer: Self-pay | Admitting: Orthopaedic Surgery

## 2021-05-14 DIAGNOSIS — Z96642 Presence of left artificial hip joint: Secondary | ICD-10-CM

## 2021-05-14 NOTE — Progress Notes (Signed)
The patient is now 2 weeks status post a left hip replacement.  He has had a little harder time with that left hip replacement compared to his right hip that was replaced many years ago.  He is stopped taking narcotic medicines.  He was having some urologic issues and is being followed by [facility].  He is only just taking Tylenol.  He has been on a baby aspirin twice a day.  I let him know that he can stop that and start regular anti-inflammatories tomorrow. ? ?His left hip incision looks good.  The staples are removed and Steri-Strips applied.  His leg lengths are equal. ? ?He will slowly increase his activities as comfort allows.  His calf are soft.  I will have him continue to work on mobility but does not have to do any exercises.  We will reevaluate him in 4 weeks.  All question concerns were answered and addressed. ?

## 2021-06-12 ENCOUNTER — Ambulatory Visit (INDEPENDENT_AMBULATORY_CARE_PROVIDER_SITE_OTHER): Payer: 59 | Admitting: Orthopaedic Surgery

## 2021-06-12 ENCOUNTER — Encounter: Payer: Self-pay | Admitting: Orthopaedic Surgery

## 2021-06-12 DIAGNOSIS — Z96642 Presence of left artificial hip joint: Secondary | ICD-10-CM

## 2021-06-12 NOTE — Progress Notes (Signed)
The patient is 6 weeks status post a left total hip arthroplasty.  He has a remote history of his right hip being replaced.  He is doing great overall.  He works in [employer] and feels comfortable going back to work next Wednesday.  He has no significant issues at all.  He did sneeze very hard recently and it causes him to heart for about a week.  He is walking without any assistive device and looks comfortable.  He lets me easily put his left hip through range of motion without any difficulty.  From my standpoint I will let him return to work starting next Wednesday, 31 May.  We will see him back in 6 months unless there are any issues to be seen before then.  At that visit we will have a standing low AP pelvis and lateral of his more recent left hip.

## 2021-11-27 ENCOUNTER — Ambulatory Visit (INDEPENDENT_AMBULATORY_CARE_PROVIDER_SITE_OTHER): Payer: No Typology Code available for payment source

## 2021-11-27 ENCOUNTER — Encounter: Payer: Self-pay | Admitting: Orthopaedic Surgery

## 2021-11-27 ENCOUNTER — Ambulatory Visit (INDEPENDENT_AMBULATORY_CARE_PROVIDER_SITE_OTHER): Payer: 59 | Admitting: Orthopaedic Surgery

## 2021-11-27 DIAGNOSIS — Z96642 Presence of left artificial hip joint: Secondary | ICD-10-CM

## 2021-11-27 NOTE — Progress Notes (Signed)
The patient down to 7 months status post a left total hip arthroplasty.  We replaced his right hip many years ago.  He is doing well overall.  He reports good range of motion and strength.  On exam both hips move smoothly and fluidly.  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  Overall though his leg lengths are equal and his hips are moving smoothly and are pain-free.  An AP pelvis and lateral of the more recent left hip shows bilateral total hip arthroplasties with good alignment and no complicating features.  At this point follow-up for his hips can be as needed.  If he does have any issues at all he knows to call us and let us know.

## 2022-01-04 ENCOUNTER — Encounter: Payer: Self-pay | Admitting: Orthopaedic Surgery

## 2022-03-26 ENCOUNTER — Encounter: Payer: Self-pay | Admitting: Radiology

## 2023-06-30 ENCOUNTER — Other Ambulatory Visit (HOSPITAL_COMMUNITY): Payer: Self-pay | Admitting: Urology

## 2023-06-30 DIAGNOSIS — R972 Elevated prostate specific antigen [PSA]: Secondary | ICD-10-CM

## 2023-07-13 ENCOUNTER — Ambulatory Visit (HOSPITAL_COMMUNITY)
Admission: RE | Admit: 2023-07-13 | Discharge: 2023-07-13 | Disposition: A | Discharge status: Home / Self Care | Source: Ambulatory Visit | Attending: Urology | Admitting: Urology

## 2023-07-13 DIAGNOSIS — R972 Elevated prostate specific antigen [PSA]: Secondary | ICD-10-CM | POA: Diagnosis present

## 2023-07-13 MED ORDER — GADOBUTROL 1 MMOL/ML IV SOLN
10.0000 mL | Freq: Once | INTRAVENOUS | Status: AC | PRN
  Administered 2023-07-13: 10 mL via INTRAVENOUS

## 2023-10-07 ENCOUNTER — Emergency Department (HOSPITAL_COMMUNITY)
Admission: EM | Admit: 2023-10-07 | Discharge: 2023-10-07 | Disposition: A | Discharge status: Home / Self Care | Source: Ambulatory Visit | Attending: Emergency Medicine | Admitting: Emergency Medicine

## 2023-10-07 ENCOUNTER — Encounter (HOSPITAL_COMMUNITY): Payer: Self-pay | Admitting: Emergency Medicine

## 2023-10-07 ENCOUNTER — Other Ambulatory Visit: Payer: Self-pay

## 2023-10-07 DIAGNOSIS — R509 Fever, unspecified: Secondary | ICD-10-CM | POA: Diagnosis present

## 2023-10-07 DIAGNOSIS — Z7982 Long term (current) use of aspirin: Secondary | ICD-10-CM | POA: Diagnosis not present

## 2023-10-07 DIAGNOSIS — I10 Essential (primary) hypertension: Secondary | ICD-10-CM | POA: Diagnosis not present

## 2023-10-07 DIAGNOSIS — Z79899 Other long term (current) drug therapy: Secondary | ICD-10-CM | POA: Diagnosis not present

## 2023-10-07 DIAGNOSIS — T8140XA Infection following a procedure, unspecified, initial encounter: Secondary | ICD-10-CM | POA: Insufficient documentation

## 2023-10-07 DIAGNOSIS — Z96642 Presence of left artificial hip joint: Secondary | ICD-10-CM | POA: Insufficient documentation

## 2023-10-07 DIAGNOSIS — D72829 Elevated white blood cell count, unspecified: Secondary | ICD-10-CM | POA: Diagnosis not present

## 2023-10-07 LAB — CBC WITH DIFFERENTIAL/PLATELET
Abs Immature Granulocytes: 0.06 K/uL (ref 0.00–0.07)
Basophils Absolute: 0.1 K/uL (ref 0.0–0.1)
Basophils Relative: 1 %
Eosinophils Absolute: 0.1 K/uL (ref 0.0–0.5)
Eosinophils Relative: 1 %
HCT: 46 % (ref 39.0–52.0)
Hemoglobin: 14.9 g/dL (ref 13.0–17.0)
Immature Granulocytes: 1 %
Lymphocytes Relative: 6 %
Lymphs Abs: 0.7 K/uL (ref 0.7–4.0)
MCH: 29 pg (ref 26.0–34.0)
MCHC: 32.4 g/dL (ref 30.0–36.0)
MCV: 89.5 fL (ref 80.0–100.0)
Monocytes Absolute: 0.6 K/uL (ref 0.1–1.0)
Monocytes Relative: 5 %
Neutro Abs: 10.3 K/uL — ABNORMAL HIGH (ref 1.7–7.7)
Neutrophils Relative %: 86 %
Platelets: 161 K/uL (ref 150–400)
RBC: 5.14 MIL/uL (ref 4.22–5.81)
RDW: 13.3 % (ref 11.5–15.5)
WBC: 11.9 K/uL — ABNORMAL HIGH (ref 4.0–10.5)
nRBC: 0 % (ref 0.0–0.2)

## 2023-10-07 LAB — BASIC METABOLIC PANEL WITH GFR
Anion gap: 14 (ref 5–15)
BUN: 18 mg/dL (ref 8–23)
CO2: 23 mmol/L (ref 22–32)
Calcium: 9 mg/dL (ref 8.9–10.3)
Chloride: 101 mmol/L (ref 98–111)
Creatinine, Ser: 1.07 mg/dL (ref 0.61–1.24)
GFR, Estimated: >60 mL/min (ref >60)
Glucose, Bld: 110 mg/dL — ABNORMAL HIGH (ref 70–99)
Potassium: 3.8 mmol/L (ref 3.5–5.1)
Sodium: 137 mmol/L (ref 135–145)

## 2023-10-07 LAB — URINALYSIS, ROUTINE W REFLEX MICROSCOPIC
Bacteria, UA: NONE SEEN
Bilirubin Urine: NEGATIVE
Glucose, UA: NEGATIVE mg/dL
Ketones, ur: NEGATIVE mg/dL
Nitrite: NEGATIVE
Protein, ur: NEGATIVE mg/dL
Specific Gravity, Urine: 1.017 (ref 1.005–1.030)
pH: 5 (ref 5.0–8.0)

## 2023-10-07 MED ORDER — SULFAMETHOXAZOLE-TRIMETHOPRIM 800-160 MG PO TABS: 1.0000 | ORAL_TABLET | Freq: Two times a day (BID) | ORAL | 0 refills | Status: AC

## 2023-10-07 MED ORDER — SODIUM CHLORIDE 0.9 % IV SOLN
1.0000 g | Freq: Once | INTRAVENOUS | Status: AC
  Administered 2023-10-07: 1 g via INTRAVENOUS
  Filled 2023-10-07: qty 10

## 2023-10-07 MED ORDER — ACETAMINOPHEN 325 MG PO TABS
650.0000 mg | ORAL_TABLET | Freq: Once | ORAL | Status: AC
  Administered 2023-10-07: 650 mg via ORAL
  Filled 2023-10-07: qty 2

## 2023-10-07 MED ORDER — CEFTRIAXONE SODIUM 1 G IJ SOLR
1.0000 g | Freq: Once | INTRAMUSCULAR | Status: AC
  Administered 2023-10-07: 1 g via INTRAVENOUS
  Filled 2023-10-07: qty 10

## 2023-10-07 MED ORDER — KETOROLAC TROMETHAMINE 30 MG/ML IJ SOLN
30.0000 mg | Freq: Once | INTRAMUSCULAR | Status: AC
  Administered 2023-10-07: 30 mg via INTRAVENOUS
  Filled 2023-10-07: qty 1

## 2023-10-07 MED ORDER — SODIUM CHLORIDE 0.9 % IV BOLUS
1000.0000 mL | Freq: Once | INTRAVENOUS | Status: AC
Start: 2023-10-07 — End: 2023-10-07
  Administered 2023-10-07: 1000 mL via INTRAVENOUS

## 2023-10-07 MED ORDER — ONDANSETRON HCL 4 MG/2ML IJ SOLN
INTRAMUSCULAR | Status: AC
  Filled 2023-10-07: qty 2

## 2023-10-07 NOTE — ED Provider Notes (Signed)
 [place] EMERGENCY DEPARTMENT AT [facility] Provider Note   CSN: [id number] Arrival date & time: 10/07/23  2120     Patient presents with: Post-op Problem   Brian Salazar is a 68 y.o. male with PMH of HTN, left total hip arthroplasty in April of 2023, recent prostate biopsy yesterday who presents today with a concern for fever.  He received 1 dose of interoperative antibiotics but was not sent home with antibiotics.  This afternoon he was febrile to 102 and felt chilled.  Temperature was confirmed on 3 different thermometers.  He denies any penile, perineal, rectal, or abdominal pain.  He denies nausea and vomiting.  He denies chest pain, shortness of breath, diarrhea, constipation.  He had no other symptoms other than feeling chilled today.  He called the nurse triage line and was instructed to present to the ER for a dose of antibiotics.  He feels fine now.  He does have some hematuria and hematochezia as well.   On reevaluation an hour later, the patient was shaking and felt chilled again.  He noted that he now had pain in his shoulders and hips from where he had been shaking.     Prior to Admission medications   Medication Sig Start Date End Date Taking? Authorizing Provider  lisinopril -hydrochlorothiazide  (ZESTORETIC ) 20-12.5 MG tablet Take 1 tablet by mouth daily. 09/05/20  Yes Luking, Glendia LABOR, MD  rosuvastatin  (CRESTOR ) 10 MG tablet Take 1 tablet (10 mg total) by mouth daily. 09/05/20  Yes Alphonsa Glendia LABOR, MD  sulfamethoxazole -trimethoprim  (BACTRIM  DS) 800-160 MG tablet Take 1 tablet by mouth 2 (two) times daily for 7 days. 10/07/23 10/14/23 Yes Cedar Ditullio, Melvenia, MD  aspirin  81 MG chewable tablet Chew 1 tablet (81 mg total) by mouth 2 (two) times daily. Patient not taking: Reported on 10/07/2023 05/03/21   Vernetta Lonni GRADE, MD  diazepam (VALIUM) 10 MG tablet Take 10 mg by mouth. Patient not taking: Reported on 10/07/2023 06/28/23   [provider]  magnesium   oxide (MAG-OX) 400 MG tablet Take 400 mg by mouth daily.    [provider]  methocarbamol  (ROBAXIN ) 500 MG tablet Take 1 tablet (500 mg total) by mouth every 6 (six) hours as needed for muscle spasms. Patient not taking: Reported on 10/07/2023 05/03/21   Vernetta Lonni GRADE, MD  oxyCODONE  (OXY IR/ROXICODONE ) 5 MG immediate release tablet Take 1-2 tablets (5-10 mg total) by mouth every 4 (four) hours as needed for moderate pain (pain score 4-6). Patient not taking: Reported on 10/07/2023 05/03/21   Vernetta Lonni GRADE, MD    Allergies: Simvastatin    Review of Systems  Updated Vital Signs BP (!) 175/80   Pulse 95   Temp 98.6 F (37 C) (Oral)   Resp 16   SpO2 100%   Physical Exam Constitutional:      General: He is not in acute distress.    Appearance: He is not ill-appearing.  HENT:     Mouth/Throat:     Mouth: Mucous membranes are moist.  Cardiovascular:     Rate and Rhythm: Normal rate and regular rhythm.     Heart sounds: Normal heart sounds. No murmur heard.    No friction rub. No gallop.  Pulmonary:     Effort: No respiratory distress.     Breath sounds: No stridor. No wheezing, rhonchi or rales.  Abdominal:     General: There is no distension.     Palpations: Abdomen is soft.  Tenderness: There is no abdominal tenderness. There is no guarding.  Musculoskeletal:        General: No swelling.     Right lower leg: No edema.     Left lower leg: No edema.  Skin:    General: Skin is warm.     Capillary Refill: Capillary refill takes less than 2 seconds.     Findings: No erythema.  Neurological:     General: No focal deficit present.     Mental Status: He is alert and oriented to person, place, and time.     (all labs ordered are listed, but only abnormal results are displayed) Labs Reviewed  URINALYSIS, ROUTINE W REFLEX MICROSCOPIC - Abnormal; Notable for the following components:      Result Value   Hgb urine dipstick SMALL (*)    Leukocytes,Ua  TRACE (*)    All other components within normal limits  CBC WITH DIFFERENTIAL/PLATELET - Abnormal; Notable for the following components:   WBC 11.9 (*)    Neutro Abs 10.3 (*)    All other components within normal limits  BASIC METABOLIC PANEL WITH GFR - Abnormal; Notable for the following components:   Glucose, Bld 110 (*)    All other components within normal limits  URINE CULTURE    EKG: None  Radiology: No results found.  Procedures   Medications Ordered in the ED  cefTRIAXone  (ROCEPHIN ) 1 g in sodium chloride  0.9 % 100 mL IVPB (0 g Intravenous Stopped 10/07/23 2238)  cefTRIAXone  (ROCEPHIN ) 1 g in sodium chloride  0.9 % 100 mL IVPB (0 g Intravenous Stopped 10/07/23 2332)  acetaminophen  (TYLENOL ) tablet 650 mg (650 mg Oral Given 10/07/23 2258)  sodium chloride  0.9 % bolus 1,000 mL (0 mLs Intravenous Stopped 10/07/23 2332)  ketorolac  (TORADOL ) 30 MG/ML injection 30 mg (30 mg Intravenous Given 10/07/23 2259)  ondansetron  (ZOFRAN ) 4 MG/2ML injection (  Given 10/07/23 2259)    Clinical Course as of 10/07/23 2339  Thu Oct 07, 2023  2254 Patient observed to be in rigors.  Pain in the hips and shoulders due to his shaking.  Temperature increased to 100.2.  Will order Tylenol , Toradol , Zofran  for nausea.  Started 1 L fluids  [NS]    Clinical Course User Index [NS] Napoleon Limes, MD   Medical Decision Making Amount and/or Complexity of Data Reviewed Labs: ordered.   This patient presents to the ED for concern of post-operative fever after prostate biopsy.  Patient was afebrile on presentation, but his temperature did increase to 100.2 while he was here.  While he was here he was originally asymptomatic but then developed rigors. He was given Tylenol  and Toradol  for his pain and rigors.  He also was experiencing nausea and received Zofran .  Urology was consulted, I spoke with Dr. Nieves from urology who recommended 2g of Rocephin  and 7 days of oral Bactrim . The patient rigors  improved with anti-pyretics and normal saline. His WBCs were mildly elevated to 11.9 but this is somewhat expected after infection. His UA showed trace leukocytes with negative nitrites. BMP normal. The patient was mildly tachycardic while he was in rigors but this resolved. At time of discharge, patient was hemodynamically stable and afebrile. He was advised to take Bactrim  BID for 7 days and follow up with urology outpatient.  He was given return precautions of increasing fever, increasing pain, increasing blood in the urine or stool.  He can continue to manage his fevers at home with Tylenol  and ibuprofen.  He was  advised not to exceed 4000 mg/day of Tylenol .   Lab Tests:  I Ordered, and personally interpreted labs.  The pertinent results include:  CBC with mild leukocytosis to 11.9. BMP within normal limits. Urinalysis which showed small amount of Hgb and trace leukocytes.  Consultations Obtained:  I requested consultation with the urologist,  and discussed lab and imaging findings as well as pertinent plan - they recommend: 2g Ceftriaxone  while the patient is here and 7 days of outpatient Bactrim . The patient will follow up with them outpatient. Spoke with Dr. Nieves over the phone.   Problem List / ED Course / Critical interventions / Medication management  Post-op chills I ordered medication including Tylenol , Toradol , NS  for rigors  Reevaluation of the patient after these medicines showed that the patient improved I have reviewed the patients home medicines and have made adjustments as needed    Final diagnoses:  Postoperative infection, unspecified type, initial encounter    ED Discharge Orders          Ordered    sulfamethoxazole -trimethoprim  (BACTRIM  DS) 800-160 MG tablet  2 times daily        10/07/23 2318               Napoleon Limes, MD 10/07/23 2339    Patt Alm Macho, MD 10/08/23 1558

## 2023-10-07 NOTE — Discharge Instructions (Addendum)
 Thank you, [name], for allowing us  to provide your care today. Today we discussed . . .  > Post-operative fevers and chills       - We gave you another dose of antibiotics here today.  We gave you Tylenol  650 and Toradol  30 mg.  You can continue to take Tylenol  and ibuprofen at home.  We will also give you a course of Bactrim  for 7 days.  Please return if you start to have more fevers.  If you start to have worsening pain, call the urology office.  Your symptoms should resolve gradually over time.  Please follow-up with your urologist.   I have ordered the following labs for you:   Lab Orders         Urine Culture         Urinalysis, Routine w reflex microscopic -Urine, Clean Catch         CBC with Differential         Basic metabolic panel       Medications ordered for pickup at your pharmacy Bactrim   Follow up: with your urologist       Brian Salazar, [place]

## 2023-10-07 NOTE — ED Triage Notes (Signed)
 Patient states he had testicular biopsy yesterday, and started having tonight. Temp 102 and called on call nurse for and informed patient to go to ED. Patients temp at 98.6 at triage. Denies any pain.

## 2023-10-10 LAB — URINE CULTURE: Culture: 50000 — AB

## 2023-10-11 ENCOUNTER — Telehealth (HOSPITAL_BASED_OUTPATIENT_CLINIC_OR_DEPARTMENT_OTHER): Payer: Self-pay | Admitting: *Deleted

## 2023-10-11 NOTE — Telephone Encounter (Signed)
 Post ED Visit - Positive Culture Follow-up  Culture report reviewed by antimicrobial stewardship pharmacist: [facility] []  Rankin Dee, Pharm.D. []  Venetia Gully, Pharm.D., BCPS AQ-ID []  Garrel Crews, Pharm.D., BCPS []  Almarie Lunger, Pharm.D., BCPS []  [place], [address].D., BCPS, AAHIVP []  Rosaline Bihari, Pharm.D., BCPS, AAHIVP []  Vernell Meier, PharmD, BCPS []  Latanya Hint, PharmD, BCPS []  Donald Medley, PharmD, BCPS []  Rocky Bold, PharmD []  Dorothyann Alert, PharmD, BCPS []  Morene Babe, PharmD  [facility] []  Rosaline Edison, PharmD []  Romona Bliss, PharmD []  Dolphus Roller, PharmD []  Veva Seip, Rph []  Vernell Daunt) Leonce, PharmD []  Eva Allis, PharmD []  Rosaline Millet, PharmD []  Iantha Batch, PharmD []  Arvin Gauss, PharmD []  Wanda Hasting, PharmD []  Ronal Rav, PharmD []  Rocky Slade, PharmD [x] Almarie Lunger, PharmD   Positive urine culture Treated with Sulfamethoxazole -Trimethoprim , organism sensitive to the same and no further patient follow-up is required at this time.  Brian Salazar 10/11/2023, 12:39 PM

## 2024-01-03 ENCOUNTER — Other Ambulatory Visit (HOSPITAL_COMMUNITY): Payer: Self-pay | Admitting: Adult Health

## 2024-01-03 DIAGNOSIS — N412 Abscess of prostate: Secondary | ICD-10-CM

## 2024-01-11 ENCOUNTER — Ambulatory Visit (HOSPITAL_COMMUNITY)
Admission: RE | Admit: 2024-01-11 | Discharge: 2024-01-11 | Disposition: A | Discharge status: Home / Self Care | Source: Ambulatory Visit | Attending: Adult Health | Admitting: Adult Health

## 2024-01-11 DIAGNOSIS — N412 Abscess of prostate: Secondary | ICD-10-CM | POA: Insufficient documentation

## 2024-01-11 MED ORDER — GADOBUTROL 1 MMOL/ML IV SOLN
9.0000 mL | Freq: Once | INTRAVENOUS | Status: AC | PRN
  Administered 2024-01-11: 9 mL via INTRAVENOUS
